# Patient Record
Sex: Male | Born: 1950 | Hispanic: No | Marital: Married
Health system: Southern US, Community
[De-identification: ages and names within clinical notes are randomized; demographics above are authoritative.]

## PROBLEM LIST (undated history)

## (undated) DIAGNOSIS — I639 Cerebral infarction, unspecified: Secondary | ICD-10-CM

## (undated) DIAGNOSIS — I10 Essential (primary) hypertension: Secondary | ICD-10-CM

## (undated) DIAGNOSIS — K559 Vascular disorder of intestine, unspecified: Secondary | ICD-10-CM

## (undated) DIAGNOSIS — E119 Type 2 diabetes mellitus without complications: Secondary | ICD-10-CM

---

## 2016-01-01 DIAGNOSIS — I639 Cerebral infarction, unspecified: Secondary | ICD-10-CM

## 2016-01-01 DIAGNOSIS — K559 Vascular disorder of intestine, unspecified: Secondary | ICD-10-CM

## 2016-01-01 HISTORY — DX: Cerebral infarction, unspecified: I63.9

## 2016-01-01 HISTORY — PX: ILEOSTOMY: SHX1783

## 2016-01-01 HISTORY — DX: Vascular disorder of intestine, unspecified: K55.9

## 2016-01-01 HISTORY — PX: TOTAL COLECTOMY: SHX852

## 2016-01-31 ENCOUNTER — Inpatient Hospital Stay
Admission: AD | Admit: 2016-01-31 | Discharge: 2016-02-07 | Disposition: A | Discharge status: Other Acute Inpatient Hospital | Payer: Managed Care, Other (non HMO) | Source: Other Acute Inpatient Hospital | Attending: Pulmonary Disease | Admitting: Pulmonary Disease

## 2016-01-31 DIAGNOSIS — Z992 Dependence on renal dialysis: Secondary | ICD-10-CM

## 2016-01-31 DIAGNOSIS — Z4659 Encounter for fitting and adjustment of other gastrointestinal appliance and device: Secondary | ICD-10-CM

## 2016-01-31 DIAGNOSIS — R579 Shock, unspecified: Secondary | ICD-10-CM | POA: Diagnosis present

## 2016-01-31 HISTORY — DX: Type 2 diabetes mellitus without complications: E11.9

## 2016-01-31 HISTORY — DX: Essential (primary) hypertension: I10

## 2016-01-31 HISTORY — DX: Vascular disorder of intestine, unspecified: K55.9

## 2016-01-31 HISTORY — DX: Cerebral infarction, unspecified: I63.9

## 2016-01-31 LAB — COMPREHENSIVE METABOLIC PANEL
ALBUMIN: 3 g/dL — AB (ref 3.5–5.0)
ALT: 58 U/L (ref 17–63)
AST: 32 U/L (ref 15–41)
Alkaline Phosphatase: 146 U/L — ABNORMAL HIGH (ref 38–126)
Anion gap: 7 (ref 5–15)
BUN: 38 mg/dL — AB (ref 6–20)
CHLORIDE: 118 mmol/L — AB (ref 101–111)
CO2: 14 mmol/L — AB (ref 22–32)
CREATININE: 1.28 mg/dL — AB (ref 0.61–1.24)
Calcium: 10.3 mg/dL (ref 8.9–10.3)
GFR calc Af Amer: >60 mL/min (ref >60)
GFR calc non Af Amer: 56 mL/min — ABNORMAL LOW (ref >60)
Glucose, Bld: 145 mg/dL — ABNORMAL HIGH (ref 65–99)
POTASSIUM: 4.9 mmol/L (ref 3.5–5.1)
SODIUM: 139 mmol/L (ref 135–145)
Total Bilirubin: 0.5 mg/dL (ref 0.3–1.2)
Total Protein: 6.2 g/dL — ABNORMAL LOW (ref 6.5–8.1)

## 2016-01-31 LAB — CBC WITH DIFFERENTIAL/PLATELET
Basophils Absolute: 0 10*3/uL (ref 0.0–0.1)
Basophils Relative: 0 %
EOS ABS: 0 10*3/uL (ref 0.0–0.7)
EOS PCT: 0 %
HCT: 32.5 % — ABNORMAL LOW (ref 39.0–52.0)
HEMOGLOBIN: 10.9 g/dL — AB (ref 13.0–17.0)
LYMPHS ABS: 1.4 10*3/uL (ref 0.7–4.0)
LYMPHS PCT: 10 %
MCH: 32.6 pg (ref 26.0–34.0)
MCHC: 33.5 g/dL (ref 30.0–36.0)
MCV: 97.3 fL (ref 78.0–100.0)
MONOS PCT: 8 %
Monocytes Absolute: 1.1 10*3/uL — ABNORMAL HIGH (ref 0.1–1.0)
Neutro Abs: 11.8 10*3/uL — ABNORMAL HIGH (ref 1.7–7.7)
Neutrophils Relative %: 82 %
PLATELETS: 296 10*3/uL (ref 150–400)
RBC: 3.34 MIL/uL — AB (ref 4.22–5.81)
RDW: 13.2 % (ref 11.5–15.5)
WBC: 14.3 10*3/uL — AB (ref 4.0–10.5)

## 2016-02-05 LAB — BASIC METABOLIC PANEL
Anion gap: 10 (ref 5–15)
BUN: 98 mg/dL — AB (ref 6–20)
BUN: 104 mg/dL — AB (ref 6–20)
BUN: 108 mg/dL — ABNORMAL HIGH (ref 6–20)
CO2: 13 mmol/L — ABNORMAL LOW (ref 22–32)
CO2: 10 mmol/L — ABNORMAL LOW (ref 22–32)
CO2: 8 mmol/L — AB (ref 22–32)
CREATININE: 3.26 mg/dL — AB (ref 0.61–1.24)
Calcium: 10.8 mg/dL — ABNORMAL HIGH (ref 8.9–10.3)
Calcium: 10.4 mg/dL — ABNORMAL HIGH (ref 8.9–10.3)
Calcium: 10.8 mg/dL — ABNORMAL HIGH (ref 8.9–10.3)
Chloride: 127 mmol/L — ABNORMAL HIGH (ref 101–111)
Creatinine, Ser: 3.53 mg/dL — ABNORMAL HIGH (ref 0.61–1.24)
Creatinine, Ser: 3.89 mg/dL — ABNORMAL HIGH (ref 0.61–1.24)
GFR calc Af Amer: 21 mL/min — ABNORMAL LOW (ref >60)
GFR calc Af Amer: 19 mL/min — ABNORMAL LOW (ref >60)
GFR calc non Af Amer: 17 mL/min — ABNORMAL LOW (ref >60)
GFR calc non Af Amer: 15 mL/min — ABNORMAL LOW (ref >60)
GFR, EST AFRICAN AMERICAN: 17 mL/min — AB (ref >60)
GFR, EST NON AFRICAN AMERICAN: 18 mL/min — AB (ref >60)
GLUCOSE: 125 mg/dL — AB (ref 65–99)
Glucose, Bld: 132 mg/dL — ABNORMAL HIGH (ref 65–99)
Glucose, Bld: 134 mg/dL — ABNORMAL HIGH (ref 65–99)
POTASSIUM: 5.9 mmol/L — AB (ref 3.5–5.1)
POTASSIUM: 6 mmol/L — AB (ref 3.5–5.1)
Potassium: 5.9 mmol/L — ABNORMAL HIGH (ref 3.5–5.1)
SODIUM: 150 mmol/L — AB (ref 135–145)
SODIUM: 150 mmol/L — AB (ref 135–145)
SODIUM: 154 mmol/L — AB (ref 135–145)

## 2016-02-05 LAB — CBC
HCT: 35.5 % — ABNORMAL LOW (ref 39.0–52.0)
Hemoglobin: 11.3 g/dL — ABNORMAL LOW (ref 13.0–17.0)
MCH: 32.6 pg (ref 26.0–34.0)
MCHC: 31.8 g/dL (ref 30.0–36.0)
MCV: 102.3 fL — AB (ref 78.0–100.0)
PLATELETS: 306 10*3/uL (ref 150–400)
RBC: 3.47 MIL/uL — AB (ref 4.22–5.81)
RDW: 14.9 % (ref 11.5–15.5)
WBC: 10.1 10*3/uL (ref 4.0–10.5)

## 2016-02-06 LAB — BASIC METABOLIC PANEL
BUN: 116 mg/dL — AB (ref 6–20)
CALCIUM: 10.3 mg/dL (ref 8.9–10.3)
CO2: 9 mmol/L — ABNORMAL LOW (ref 22–32)
Chloride: >130 mmol/L (ref 101–111)
Creatinine, Ser: 4.59 mg/dL — ABNORMAL HIGH (ref 0.61–1.24)
GFR calc Af Amer: 14 mL/min — ABNORMAL LOW (ref >60)
GFR, EST NON AFRICAN AMERICAN: 12 mL/min — AB (ref >60)
Glucose, Bld: 151 mg/dL — ABNORMAL HIGH (ref 65–99)
POTASSIUM: 6 mmol/L — AB (ref 3.5–5.1)
SODIUM: 154 mmol/L — AB (ref 135–145)

## 2016-02-06 LAB — POTASSIUM: POTASSIUM: 5.8 mmol/L — AB (ref 3.5–5.1)

## 2016-02-07 ENCOUNTER — Inpatient Hospital Stay (HOSPITAL_COMMUNITY)
Admission: AD | Admit: 2016-02-07 | Discharge: 2016-02-10 | DRG: 871 | Disposition: A | Discharge status: Other Acute Inpatient Hospital | Payer: Managed Care, Other (non HMO) | Source: Ambulatory Visit | Attending: Pulmonary Disease | Admitting: Pulmonary Disease

## 2016-02-07 ENCOUNTER — Other Ambulatory Visit (HOSPITAL_COMMUNITY): Payer: Managed Care, Other (non HMO)

## 2016-02-07 ENCOUNTER — Inpatient Hospital Stay (HOSPITAL_COMMUNITY): Payer: Managed Care, Other (non HMO)

## 2016-02-07 ENCOUNTER — Encounter: Payer: Self-pay | Admitting: Pulmonary Disease

## 2016-02-07 DIAGNOSIS — N179 Acute kidney failure, unspecified: Secondary | ICD-10-CM | POA: Diagnosis present

## 2016-02-07 DIAGNOSIS — E870 Hyperosmolality and hypernatremia: Secondary | ICD-10-CM | POA: Diagnosis present

## 2016-02-07 DIAGNOSIS — G9340 Encephalopathy, unspecified: Secondary | ICD-10-CM

## 2016-02-07 DIAGNOSIS — E119 Type 2 diabetes mellitus without complications: Secondary | ICD-10-CM | POA: Diagnosis present

## 2016-02-07 DIAGNOSIS — E871 Hypo-osmolality and hyponatremia: Secondary | ICD-10-CM | POA: Diagnosis present

## 2016-02-07 DIAGNOSIS — I69351 Hemiplegia and hemiparesis following cerebral infarction affecting right dominant side: Secondary | ICD-10-CM | POA: Diagnosis not present

## 2016-02-07 DIAGNOSIS — R6521 Severe sepsis with septic shock: Secondary | ICD-10-CM | POA: Diagnosis not present

## 2016-02-07 DIAGNOSIS — Z932 Ileostomy status: Secondary | ICD-10-CM | POA: Diagnosis not present

## 2016-02-07 DIAGNOSIS — E872 Acidosis: Secondary | ICD-10-CM | POA: Diagnosis not present

## 2016-02-07 DIAGNOSIS — R4701 Aphasia: Secondary | ICD-10-CM | POA: Diagnosis present

## 2016-02-07 DIAGNOSIS — G9341 Metabolic encephalopathy: Secondary | ICD-10-CM | POA: Diagnosis present

## 2016-02-07 DIAGNOSIS — L8990 Pressure ulcer of unspecified site, unspecified stage: Secondary | ICD-10-CM | POA: Insufficient documentation

## 2016-02-07 DIAGNOSIS — D649 Anemia, unspecified: Secondary | ICD-10-CM | POA: Diagnosis present

## 2016-02-07 DIAGNOSIS — A419 Sepsis, unspecified organism: Principal | ICD-10-CM | POA: Diagnosis present

## 2016-02-07 DIAGNOSIS — R4182 Altered mental status, unspecified: Secondary | ICD-10-CM | POA: Diagnosis present

## 2016-02-07 DIAGNOSIS — K560 Paralytic ileus: Secondary | ICD-10-CM

## 2016-02-07 DIAGNOSIS — E669 Obesity, unspecified: Secondary | ICD-10-CM | POA: Diagnosis present

## 2016-02-07 DIAGNOSIS — E874 Mixed disorder of acid-base balance: Secondary | ICD-10-CM | POA: Diagnosis present

## 2016-02-07 DIAGNOSIS — Z9049 Acquired absence of other specified parts of digestive tract: Secondary | ICD-10-CM | POA: Diagnosis not present

## 2016-02-07 DIAGNOSIS — R579 Shock, unspecified: Secondary | ICD-10-CM | POA: Diagnosis present

## 2016-02-07 DIAGNOSIS — K56609 Unspecified intestinal obstruction, unspecified as to partial versus complete obstruction: Secondary | ICD-10-CM

## 2016-02-07 DIAGNOSIS — I10 Essential (primary) hypertension: Secondary | ICD-10-CM | POA: Diagnosis present

## 2016-02-07 DIAGNOSIS — Z6832 Body mass index (BMI) 32.0-32.9, adult: Secondary | ICD-10-CM | POA: Diagnosis not present

## 2016-02-07 DIAGNOSIS — E875 Hyperkalemia: Secondary | ICD-10-CM | POA: Diagnosis present

## 2016-02-07 LAB — BASIC METABOLIC PANEL
ANION GAP: 15 (ref 5–15)
Anion gap: 14 (ref 5–15)
BUN: 127 mg/dL — AB (ref 6–20)
BUN: 133 mg/dL — ABNORMAL HIGH (ref 6–20)
CALCIUM: 10.8 mg/dL — AB (ref 8.9–10.3)
CALCIUM: 9.9 mg/dL (ref 8.9–10.3)
CO2: 9 mmol/L — ABNORMAL LOW (ref 22–32)
CO2: 8 mmol/L — ABNORMAL LOW (ref 22–32)
CREATININE: 6.46 mg/dL — AB (ref 0.61–1.24)
CREATININE: 6.79 mg/dL — AB (ref 0.61–1.24)
Chloride: 124 mmol/L — ABNORMAL HIGH (ref 101–111)
Chloride: 123 mmol/L — ABNORMAL HIGH (ref 101–111)
GFR calc Af Amer: 9 mL/min — ABNORMAL LOW (ref >60)
GFR calc non Af Amer: 7 mL/min — ABNORMAL LOW (ref >60)
GFR, EST AFRICAN AMERICAN: 9 mL/min — AB (ref >60)
GFR, EST NON AFRICAN AMERICAN: 8 mL/min — AB (ref >60)
GLUCOSE: 151 mg/dL — AB (ref 65–99)
Glucose, Bld: 169 mg/dL — ABNORMAL HIGH (ref 65–99)
Potassium: 6.1 mmol/L — ABNORMAL HIGH (ref 3.5–5.1)
Potassium: 6 mmol/L — ABNORMAL HIGH (ref 3.5–5.1)
SODIUM: 145 mmol/L (ref 135–145)
Sodium: 148 mmol/L — ABNORMAL HIGH (ref 135–145)

## 2016-02-07 LAB — BLOOD GAS, ARTERIAL
Acid-base deficit: 21.2 mmol/L — ABNORMAL HIGH (ref 0.0–2.0)
BICARBONATE: 6.2 meq/L — AB (ref 20.0–24.0)
FIO2: 0.21
O2 SAT: 97.2 %
PATIENT TEMPERATURE: 98.6
PO2 ART: 119 mmHg — AB (ref 80.0–100.0)
TCO2: 6.8 mmol/L (ref 0–100)
pCO2 arterial: 18.9 mmHg — CL (ref 35.0–45.0)
pH, Arterial: 7.144 — CL (ref 7.350–7.450)

## 2016-02-07 LAB — CBC
HCT: 41.4 % (ref 39.0–52.0)
HCT: 33.7 % — ABNORMAL LOW (ref 39.0–52.0)
Hemoglobin: 13.2 g/dL (ref 13.0–17.0)
Hemoglobin: 10.4 g/dL — ABNORMAL LOW (ref 13.0–17.0)
MCH: 33 pg (ref 26.0–34.0)
MCH: 32.1 pg (ref 26.0–34.0)
MCHC: 31.9 g/dL (ref 30.0–36.0)
MCHC: 30.9 g/dL (ref 30.0–36.0)
MCV: 103.5 fL — ABNORMAL HIGH (ref 78.0–100.0)
MCV: 104 fL — ABNORMAL HIGH (ref 78.0–100.0)
Platelets: 278 10*3/uL (ref 150–400)
Platelets: 276 10*3/uL (ref 150–400)
RBC: 4 MIL/uL — ABNORMAL LOW (ref 4.22–5.81)
RBC: 3.24 MIL/uL — ABNORMAL LOW (ref 4.22–5.81)
RDW: 14.8 % (ref 11.5–15.5)
RDW: 14.7 % (ref 11.5–15.5)
WBC: 17.7 10*3/uL — ABNORMAL HIGH (ref 4.0–10.5)
WBC: 16.3 10*3/uL — ABNORMAL HIGH (ref 4.0–10.5)

## 2016-02-07 LAB — LACTIC ACID, PLASMA: LACTIC ACID, VENOUS: 1.2 mmol/L (ref 0.5–1.9)

## 2016-02-07 LAB — MRSA PCR SCREENING: MRSA BY PCR: POSITIVE — AB

## 2016-02-07 MED ORDER — FAMOTIDINE IN NACL 20-0.9 MG/50ML-% IV SOLN: 20.0000 mg | INTRAVENOUS | Status: DC

## 2016-02-07 MED ORDER — SODIUM CHLORIDE 0.9 % IV SOLN: 250.0000 mL | INTRAVENOUS | Status: DC | PRN

## 2016-02-07 MED ORDER — CHLORHEXIDINE GLUCONATE 0.12 % MT SOLN
15.0000 mL | Freq: Two times a day (BID) | OROMUCOSAL | Status: DC
  Administered 2016-02-08 – 2016-02-10 (×6): 15 mL via OROMUCOSAL

## 2016-02-07 MED ORDER — HEPARIN SODIUM (PORCINE) 1000 UNIT/ML DIALYSIS
1000.0000 [IU] | INTRAMUSCULAR | Status: DC | PRN
  Filled 2016-02-07: qty 6

## 2016-02-07 MED ORDER — SODIUM BICARBONATE 8.4 % IV SOLN
INTRAVENOUS | Status: DC
Start: 2016-02-07 — End: 2016-02-07
  Filled 2016-02-07 (×2): qty 150

## 2016-02-07 MED ORDER — NOREPINEPHRINE BITARTRATE 1 MG/ML IV SOLN
5.0000 ug/min | INTRAVENOUS | Status: DC
  Administered 2016-02-08: 15 ug/min via INTRAVENOUS
  Filled 2016-02-07: qty 4

## 2016-02-07 MED ORDER — SODIUM CHLORIDE 0.9 % IJ SOLN
250.0000 [IU]/h | INTRAMUSCULAR | Status: DC
  Administered 2016-02-08: 950 [IU]/h via INTRAVENOUS_CENTRAL
  Administered 2016-02-08 – 2016-02-09 (×2): 1250 [IU]/h via INTRAVENOUS_CENTRAL
  Filled 2016-02-07 (×5): qty 2

## 2016-02-07 MED ORDER — STERILE WATER FOR INJECTION IV SOLN
INTRAVENOUS | Status: DC
  Administered 2016-02-07 – 2016-02-08 (×5): via INTRAVENOUS_CENTRAL
  Filled 2016-02-07 (×16): qty 150

## 2016-02-07 MED ORDER — HEPARIN BOLUS VIA INFUSION (CRRT)
1000.0000 [IU] | INTRAVENOUS | Status: DC | PRN
  Administered 2016-02-08 (×2): 1000 [IU] via INTRAVENOUS_CENTRAL
  Filled 2016-02-07: qty 1000

## 2016-02-07 MED ORDER — HEPARIN SODIUM (PORCINE) 5000 UNIT/ML IJ SOLN
5000.0000 [IU] | Freq: Three times a day (TID) | INTRAMUSCULAR | Status: DC
Start: 2016-02-07 — End: 2016-02-07

## 2016-02-07 MED ORDER — CETYLPYRIDINIUM CHLORIDE 0.05 % MT LIQD
7.0000 mL | Freq: Two times a day (BID) | OROMUCOSAL | Status: DC
  Administered 2016-02-08 – 2016-02-10 (×5): 7 mL via OROMUCOSAL

## 2016-02-07 MED ORDER — MUPIROCIN 2 % EX OINT
1.0000 "application " | TOPICAL_OINTMENT | Freq: Two times a day (BID) | CUTANEOUS | Status: DC
  Administered 2016-02-08 – 2016-02-10 (×6): 1 via NASAL
  Filled 2016-02-07 (×2): qty 22

## 2016-02-07 MED ORDER — FAMOTIDINE IN NACL 20-0.9 MG/50ML-% IV SOLN
20.0000 mg | Freq: Two times a day (BID) | INTRAVENOUS | Status: DC
  Administered 2016-02-07 – 2016-02-09 (×4): 20 mg via INTRAVENOUS
  Filled 2016-02-07 (×5): qty 50

## 2016-02-07 MED ORDER — SODIUM CHLORIDE 0.9 % IV SOLN: INTRAVENOUS | Status: DC

## 2016-02-07 MED ORDER — STERILE WATER FOR INJECTION IV SOLN
INTRAVENOUS | Status: DC
  Administered 2016-02-07 – 2016-02-08 (×3): via INTRAVENOUS_CENTRAL
  Filled 2016-02-07 (×10): qty 150

## 2016-02-07 MED ORDER — HEPARIN (PORCINE) 2000 UNITS/L FOR CRRT
INTRAVENOUS_CENTRAL | Status: DC | PRN
  Administered 2016-02-07 – 2016-02-08 (×2): via INTRAVENOUS_CENTRAL
  Filled 2016-02-07 (×9): qty 1000

## 2016-02-07 MED ORDER — CHLORHEXIDINE GLUCONATE CLOTH 2 % EX PADS
6.0000 | MEDICATED_PAD | Freq: Every day | CUTANEOUS | Status: DC
  Administered 2016-02-08 – 2016-02-10 (×3): 6 via TOPICAL

## 2016-02-07 MED ORDER — PRISMASOL BGK 0/2.5 32-2.5 MEQ/L IV SOLN
INTRAVENOUS | Status: DC
  Administered 2016-02-07 – 2016-02-08 (×5): via INTRAVENOUS_CENTRAL
  Filled 2016-02-07 (×12): qty 5000

## 2016-02-07 MED ORDER — PIPERACILLIN-TAZOBACTAM 3.375 G IVPB 30 MIN
3.3750 g | Freq: Four times a day (QID) | INTRAVENOUS | Status: DC
  Administered 2016-02-07 – 2016-02-09 (×7): 3.375 g via INTRAVENOUS
  Filled 2016-02-07 (×11): qty 50

## 2016-02-07 MED ORDER — HEPARIN SODIUM (PORCINE) 1000 UNIT/ML IJ SOLN
2.4000 mL | Freq: Once | INTRAMUSCULAR | Status: AC
  Administered 2016-02-07: 3000 [IU] via INTRAVENOUS
  Filled 2016-02-07: qty 3

## 2016-02-07 MED ORDER — SODIUM CHLORIDE 0.9 % IV SOLN
250.0000 mL | INTRAVENOUS | Status: DC | PRN
  Administered 2016-02-07: 1000 mL via INTRAVENOUS
  Administered 2016-02-08 – 2016-02-10 (×2): 250 mL via INTRAVENOUS

## 2016-02-07 MED ORDER — SODIUM BICARBONATE 8.4 % IV SOLN
100.0000 meq | Freq: Once | INTRAVENOUS | Status: AC
  Administered 2016-02-07: 100 meq via INTRAVENOUS
  Filled 2016-02-07: qty 100

## 2016-02-07 MED ORDER — HEPARIN SODIUM (PORCINE) 5000 UNIT/ML IJ SOLN
5000.0000 [IU] | Freq: Three times a day (TID) | INTRAMUSCULAR | Status: DC
  Filled 2016-02-07: qty 1

## 2016-02-07 NOTE — H&P (Signed)
PULMONARY / CRITICAL CARE MEDICINE   Name:           Brett Jenkins MRN:             [id number] DOB:             12/05/1950                       ADMISSION DATE:  01/31/2016 CONSULTATION DATE:  8/7  REFERRING MD:  D. Hijazi  CHIEF COMPLAINT:  AMS  HISTORY OF PRESENT ILLNESS: Patient is encephalopathic and/or intubated. Therefore history has been obtained from chart review.  65 year old male who was recently [facility] to [facility] for L sided CVA. He underwent embolectomy of L MCA. Angioplasty of the internal carotid was unsuccessful. 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 After extubation he was transferred to [facility] for [facility]. [facility] has been complicated by worsening renal failure despite IVF hydration. Creatinine was initially 3.26 and has risen to 6.79. He also developed hyperkalemia and hyponatremia. WBC rose as well so he was started on broad spectrum antibiotics. 8/7 his mental status started to deteriorate and he developed profound acidemia. PCCM ashed to see for further evaluation.    PAST MEDICAL HISTORY :  He  has a past medical history of CVA (cerebral infarction) (01/2016); DM (diabetes mellitus) (HCC); HTN (hypertension); and Ischemic colitis (HCC) (01/2016).  PAST SURGICAL HISTORY: He  has a past surgical history that includes Total colectomy (01/2016) and Ileostomy (01/2016).  Allergies not on file  No current facility-administered medications on file prior to encounter.    No current outpatient prescriptions on file prior to encounter.    FAMILY HISTORY:  His has no family status information on file.    SOCIAL HISTORY: He    REVIEW OF SYSTEMS:   Unable as patient is encephalopathic  SUBJECTIVE:    VITAL  SIGNS: There were no vitals taken for this visit.  HEMODYNAMICS: PAP: ()/()   VENTILATOR SETTINGS:    INTAKE / OUTPUT: No intake/output data recorded.  PHYSICAL EXAMINATION: General:  Obese male in NAD Neuro:  Obtunded, Does not respond to pain HEENT:  [place]/AT, PERRL, no JVD. NGT in place Cardiovascular:  IRIR, rate controlled. No MRG Lungs:  Clear bilateral breath sounds Abdomen:  Soft, non-distended. Longitudinal surgical scar (recent with steri strips in place), Ileostomy draining large volume dark green stool.  Musculoskeletal:  No acute deformity Skin:  Grossly intact  LABS:  BMET  Last Labs    Recent Labs Lab 02/06/16 0801 02/06/16 1502 02/07/16 0536 02/07/16 1013  NA 154*  --  148* 145  K 6.0* 5.8* 6.1* 6.0*  CL >130*  --  124* 123*  CO2 9*  --  9* 8*  BUN 116*  --  127* 133*  CREATININE 4.59*  --  6.46* 6.79*  GLUCOSE 151*  --  151* 169*      Electrolytes  Last Labs    Recent Labs Lab 02/06/16 0801 02/07/16 0536 02/07/16 1013  CALCIUM 10.3 10.8* 9.9      CBC  Last Labs    Recent Labs Lab 02/05/16 0619 02/07/16 0536 02/07/16 1013  WBC 10.1 17.7* 16.3*  HGB 11.3* 13.2 10.4*  HCT 35.5* 41.4 33.7*  PLT 306 278 276      Coag's  Last Labs   No results for input(s): APTT, INR in the last 168 hours.    Sepsis Markers Last Labs   No results for input(s): LATICACIDVEN, PROCALCITON, O2SATVEN in the last 168 hours.    ABG  Last Labs    Recent Labs Lab 02/07/16 0926  PHART 7.144*  PCO2ART 18.9*  PO2ART 119*      Liver Enzymes  Last Labs    Recent Labs Lab 01/31/16 1809  AST 32  ALT 58  ALKPHOS 146*  BILITOT 0.5  ALBUMIN 3.0*      Cardiac Enzymes Last Labs   No results for input(s): TROPONINI, PROBNP in the last 168 hours.    Glucose Last Labs   No results for input(s): GLUCAP in the last 168 hours.    Imaging Dg Abd Portable 1v  Result Date: 02/07/2016 CLINICAL DATA:  Feeding  tube placement. EXAM: PORTABLE ABDOMEN - 1 VIEW COMPARISON:  None. FINDINGS: Feeding tube is in place with the tip projecting in the midbody of the stomach. Gaseous distention of small bowel noted. IMPRESSION: Feeding tube tip projects in the midbody of the stomach. Gaseous distention of small bowel. Electronically Signed   By: Drusilla Kanner M.D.   On: 02/07/2016 10:44     STUDIES:    CULTURES: 8/7 BCx2 8/7 Urine Cx  ANTIBIOTICS: Zosyn 8/7 >>> Vancomycin 8/7 >>>  SIGNIFICANT EVENTS: 8/7 [facility] for renal failure and AMS  LINES/TUBES:   DISCUSSION: 65 year old male with recent stroke with now global aphasia and R sided weakness. Also with recent bowel ischemia s/p colectomy and ileostomy. Presented to [facility] from [facility] 7/31 for further rehab efforts. [facility] course complicated by renal failure and AMS. Transferred to ICU with plans for hydration, Bicarb gtt, pressors, likely intubation and CRRT.   ASSESSMENT / PLAN:  PULMONARY A: Inability to protect airway  P:   Intubate for airway protection Full vent support ABG CXR Vent bundle  CARDIOVASCULAR A:  Shock, hypovolemic vs sepsis  P:  Telemetry monitoring MAP goal > Levophed for MAP goal Gentle IVF Assess lactic acid, troponin x3, and cortisol.   RENAL A:   Acute renal failure (Creatinine 3.26 > 6.79) Hypernatremia Hyperkalemia  P:   Transfer to ICU Consult nephrology Will likely need CRRT in setting shock Place temp HD catheter  Repeat CMP after interventions made in [facility] Treat electrolyte abnormalities as indicated.   GASTROINTESTINAL A:   Ileostomy status Nutrition  P:   NPO NGT to LIS Pepcid daily for SUP in setting renal failure  HEMATOLOGIC A:   Anemia   P:  Follow CBC Transfuse per ICU guidelines  INFECTIOUS A:   SIRS ? Sepsis  P:   Broad spectrum ABX as above Assess PCT Follow cultures  ENDOCRINE A:   DM  P:   CBG monitoring  and SSI q 4 hours.   NEUROLOGIC A:   Acute metabolic encephalopathy in setting uremia and profound acidosis  P:   RASS goal: 0 PRN fentanyl if needed    FAMILY  - Updates:    - Inter-disciplinary family meet or Palliative Care meeting due by:  8/14  [name], AGACNP-BC [place] [facility]monology/Critical Care Pager [phone] or [phone]  02/07/2016 5:19 PM   ATTENDING NOTE: I have personally reviewed patient's available data, including medical history, events of note, physical examination and test results as part of my evaluation. I have discussed with resident/NP and other careteam providers such as pharmacist, RN and  RRT & co-ordinated with consultants. 65 year old man with diabetes and hypertension, left MCA CVA- [facility] underwent total colectomy on 7/20 for ischemic colitis and was recuperating with a colostomy. He was transferred on lisinopril for hypertension-his creatinine is increased from 1.2 baseline to 6.8 currently and his potassium and increased to 6 he was noted to be hypotensive today, we were requested to [facility] for CRRT  On exam-lethargic, does not follow commands, eyes open, acidotic breathing, decreased breath sounds bilateral, decreased movement on right side, soft nontender abdomen with midline scar,ileostomy appears pink  Labs showed hyperkalemia at 6.0 with severe acidosis 7.14 and leukocytosis 16  X-ray KUB shows small bowel dilatation  Impression/plan-  AKI with severe metabolic acidosis- could be related to lisinopril, have to consider new sepsis or ischemic bowel -We'll place HD catheter and plan for CRRT -Start bicarbonate drip  Septic shock- unclear source, have to rule out ischemic bowel We'll obtain CT abdomen with oral contrast when more stable Add Levophed aim for MAP 65 and above Empiric Zosyn in the meantime NG tube to decompression  I called his wife and  updated her in detail- she would like all resuscitative measures at this time but does understand how sick he is with multi -organ failure Rest per NP/medical resident whose note is outlined above and that I agree with and edited in full.    The patient is critically ill with multiple organ systems failure and requires high complexity decision making for assessment and support, frequent evaluation and titration of therapies, application of advanced monitoring technologies and extensive interpretation of multiple databases. Critical Care Time devoted to patient care services described in this note independent of APP time is 55 minutes.    Cyril Mourning MD. Tonny Bollman. [place] [facility] Pager [phone] If no response call [phone]   02/07/2016

## 2016-02-07 NOTE — Progress Notes (Signed)
Pharmacy Antibiotic Note Brett Jenkins is a 65 y.o. male admitted on 02/07/2016 with sepsis in setting of AKI and tentative plans for CRRT. Pharmacy has been consulted for Zosyn dosing.  Plan:  Zosyn 3.375 grams IV every 6 hours infused over 30 minutes while receiving CRRT    Temp (24hrs), Avg:0 F (-17.8 C), Min:, Max:   Recent Labs Lab 02/05/16 0619 02/05/16 1521 02/05/16 2243 02/06/16 0801 02/07/16 0536 02/07/16 1013  WBC 10.1  --   --   --  17.7* 16.3*  CREATININE 3.26* 3.53* 3.89* 4.59* 6.46* 6.79*    CrCl cannot be calculated (Unknown ideal weight.).  Allergies not on file  Antimicrobials this admission: 8/7 Zosyn >>   Dose adjustments this admission: n/a  Microbiology results: 8/7 BCx: px   Thank you for allowing pharmacy to be a part of this patient's care.  Pollyann SamplesAndy Demaurion Dicioccio, PharmD, BCPS 02/07/2016, 6:34 PM Pager: [phone]

## 2016-02-07 NOTE — Procedures (Signed)
Hemodialysis Insertion Procedure Note Brett Jenkins [id number] 02/20/1951  Procedure: Insertion of Hemodialysis Catheter Type: 3 port  Indications: Hemodialysis   Procedure Details Consent: Risks of procedure as well as the alternatives and risks of each were explained to the (patient/caregiver).  Consent for procedure obtained. Time Out: Verified patient identification, verified procedure, site/side was marked, verified correct patient position, special equipment/implants available, medications/allergies/relevent history reviewed, required imaging and test results available.  Performed  Maximum sterile technique was used including antiseptics, cap, gloves, gown, hand hygiene, mask and sheet. Skin prep: Chlorhexidine; local anesthetic administered A antimicrobial bonded/coated triple lumen catheter was placed in the right internal jugular vein using the Seldinger technique. Ultrasound guidance used.Yes.   Catheter placed to 15 cm. Blood aspirated via all 3 ports and then flushed x 3. Line sutured x 2 and dressing applied.  Evaluation Blood flow good Complications: No apparent complications Patient did tolerate procedure well. Chest X-ray ordered to verify placement.  CXR: pending.  Brett RoachPaul Raunak Antuna, AGACNP-BC [facility]/Critical Care Pager [phone] or [phone]  02/07/2016 6:26 PM

## 2016-02-07 NOTE — Consult Note (Signed)
Reason for Consult:ARF, hyperkalemia, metabolic acidosis Referring Physician: Marchelle Gearingamaswamy, MD  Brett Jenkins is an 65 y.o. male.  HPI: Brett Jenkins is a 65 yo WM with PMH significant for DM, HTN, who was admitted [facility] on 01[facility]/16 after Brett Jenkins developed an acute ischemic, Left MCA stroke complicated by right hemiparesis, dysphagia, and global aphasia.  His hospitalization was complicated by severe sepsis syndrome secondary to E. Coli urosepsis and developed ischemic colitis and underwent colectomy on 01/20/16 with ileostomy.  Brett Jenkins was [facility] for [facility] on 01/31/16, however Brett Jenkins developed hypotension and progressive renal failure with subsequent hyperkalemia and profound metabolic acidosis.  His trend in Scr is seen below.  Brett Jenkins was transferred to [facility] for further evaluation and management of his SIRS, ARF and metabolic abnormalities.  We were consulted for initiation of CVVHD to help correct his metabolic derangements.  Of note, Brett Jenkins had been on metformin and lisinopril-HCT prior to his admission to the MICU today.  These have since been discontinued.  Trend in Creatinine: Creatinine, Ser  Date/Time Value Ref Range Status  02/07/2016 10:13 AM 6.79 (H) 0.61 - 1.24 mg/dL Final  16/10/960408/01/2016 54:0905:36 AM 6.46 (H) 0.61 - 1.24 mg/dL Final  81/19/147808/12/2015 29:5608:01 AM 4.59 (H) 0.61 - 1.24 mg/dL Final  21/30/865708/11/2015 84:6910:43 PM 3.89 (H) 0.61 - 1.24 mg/dL Final  62/95/284108/11/2015 32:4403:21 PM 3.53 (H) 0.61 - 1.24 mg/dL Final  01/02/725308/11/2015 66:4406:19 AM 3.26 (H) 0.61 - 1.24 mg/dL Final  03/47/425907/31/2017 56:3806:09 PM 1.28 (H) 0.61 - 1.24 mg/dL Final    PMH:   Past Medical History:  Diagnosis Date  . CVA (cerebral infarction) 01/2016  . DM (diabetes mellitus) (HCC)   . HTN (hypertension)   . Ischemic colitis (HCC) 01/2016    PSH:   Past Surgical History:  Procedure Laterality Date  . ILEOSTOMY  01/2016  . TOTAL COLECTOMY  01/2016    Allergies: Allergies not on  file  Medications:   Prior to Admission medications   Not on File    Inpatient medications: . famotidine (PEPCID) IV  20 mg Intravenous Q12H  . heparin  5,000 Units Subcutaneous Q8H  . piperacillin-tazobactam  3.375 g Intravenous Q6H    Discontinued Meds:  There are no discontinued medications.  Social History:  has no tobacco, alcohol, and drug history on file.  Family History:  No family history on file.  Review of systems not obtained due to patient factors. Weight change:  No intake or output data in the 24 hours ending 02/07/16 1857  Pulse: 75, BP 97/48, RR 19, Pox 99% on RA   General appearance: unresponsive, obtunded Head: Normocephalic, without obvious abnormality, atraumatic Resp: clear to auscultation bilaterally Cardio: regular rate and rhythm, S1, S2 normal, no murmur, click, rub or gallop GI: ileostomy in RUQ, with green substance in collection bag, +BS, soft Extremities: extremities normal, atraumatic, no cyanosis or edema  Labs: Basic Metabolic Panel:  Recent Labs Lab 02/05/16 0619 02/05/16 1521 02/05/16 2243 02/06/16 0801 02/06/16 1502 02/07/16 0536 02/07/16 1013  NA 150* 150* 154* 154*  --  148* 145  K 5.9* 6.0* 5.9* 6.0* 5.8* 6.1* 6.0*  CL 127* >130* >130* >130*  --  124* 123*  CO2 13* 10* 8* 9*  --  9* 8*  GLUCOSE 125* 132* 134* 151*  --  151* 169*  BUN 98* 104* 108* 116*  --  127* 133*  CREATININE 3.26* 3.53* 3.89* 4.59*  --  6.46* 6.79*  CALCIUM 10.8* 10.4* 10.8* 10.3  --  10.8* 9.9   Liver Function Tests: No results for input(s): AST, ALT, ALKPHOS, BILITOT, PROT, ALBUMIN in the last 168 hours. No results for input(s): LIPASE, AMYLASE in the last 168 hours. No results for input(s): AMMONIA in the last 168 hours. CBC:  Recent Labs Lab 02/05/16 0619 02/07/16 0536 02/07/16 1013  WBC 10.1 17.7* 16.3*  HGB 11.3* 13.2 10.4*  HCT 35.5* 41.4 33.7*  MCV 102.3* 103.5* 104.0*  PLT 306 278 276   PT/INR: (inr:5) Cardiac  Enzymes: )No results for input(s): CKTOTAL, CKMB, CKMBINDEX, TROPONINI in the last 168 hours. CBG: No results for input(s): GLUCAP in the last 168 hours.  Iron Studies: No results for input(s): IRON, TIBC, TRANSFERRIN, FERRITIN in the last 168 hours.  Xrays/Other Studies: Dg Chest Port 1 View  Result Date: 02/07/2016 CLINICAL DATA:  Catheter placement. EXAM: PORTABLE CHEST 1 VIEW COMPARISON:  None. FINDINGS: The heart size and mediastinal contours are within normal limits. Both lungs are clear. No pneumothorax or pleural effusion is noted. Nasogastric tube is seen entering stomach. Right internal jugular catheter is noted with distal tip in expected position of SVC. The visualized skeletal structures are unremarkable. IMPRESSION: Right internal jugular catheter tip seen in expected position of the SVC. No pneumothorax is noted. Electronically Signed   By: Lupita Raider, M.D.   On: 02/07/2016 18:44   Dg Abd Portable 1v  Result Date: 02/07/2016 CLINICAL DATA:  Feeding tube placement. EXAM: PORTABLE ABDOMEN - 1 VIEW COMPARISON:  None. FINDINGS: Feeding tube is in place with the tip projecting in the midbody of the stomach. Gaseous distention of small bowel noted. IMPRESSION: Feeding tube tip projects in the midbody of the stomach. Gaseous distention of small bowel. Electronically Signed   By: Drusilla Kanner M.D.   On: 02/07/2016 10:44     Assessment/Plan: 1.  ARF- in setting of hypotension and concomitant ACE-inhibitor, diuretic, and metformin.   1. ACE/HCT and metformin stopped 2. Will initiate CVVHD with 0K bath and follow electrolytes 3. Renal US to r/o Obstruction 4. Continue to follow I's/O's and lab values 2. Hyperkalemia- due to #1 1. CVVD and bicarb as above 3. Metabolic acidosis due to ARF and metformin use.   1. Metformin stopped 2. Use bicarb with CVVHD 4. Severe sepsis syndrome- unclear etiology but has history of urosepsis at [facility].  Pan culture, antibiotics per PCCM 5. DM-  per primary svc 6. HTN- off meds for now in light of sepsis.   Julien Nordmann Kaoir Loree 02/07/2016, 6:57 PM

## 2016-02-07 NOTE — Consult Note (Signed)
[facility] CONSULT NOTE    Date: 02/07/2016                  Patient Name:  Brett Jenkins  MRN: [id number]  DOB: 05/08/1951  Age / Sex: 65 y.o., male         PCP: No PCP Per Patient                 Service Requesting Consult: Dr. Sharyon MedicusHijazi                 Reason for Consult: Severe acute renal failure with hyperkalemia and metabolic acidosis            History of Present Illness: Patient is a 65 y.o. male with a PMHx of diabetes mellitus type 2, hypertension, dysphagia, history of ischemic left MCA stroke, obesity, who was admitted to [facility] on 01/31/2016 for ongoing medical management status post recent ischemic left MCA stroke, dysphasia.  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  On 01/24/2016 unfortunately he suffered a left MCA distribution CVA with hemorrhage in the left basal ganglia.  We are asked now to see him for acute renal failure.  While at the outside hospital his BUN was 39 with a creatinine of 1.20.  Renal function has deteriorated over the course of this hospitalization. BUN is currently 133 with a creatinine of 6.79 and potassium 6.0. There is also severe acidosis with a serum bicarbonate of 8. Patient is unable unfortunately provide any history at this point in time. I discussed the case with Dr. Sharyon MedicusHijazi and recommended that continuous renal replacement therapy be considered given hypotension, severe acidosis, hyperkalemia, and acute renal failure.   Medications: Current medications: D5W, Humalog insulin, allopurinol 300 mg daily, amlodipine 5 mg daily, aspirin 325 mg daily, atenolol 50 mg daily, atorvastatin 80 mg daily, famotidine 20 mg twice a day, fish oil 6 g daily, heparin 5000 units subcutaneous every 8 hours, modafinil 100 mg daily, nicotine patch 14 mg daily, sertraline 250 mg daily, thiamine 100 mg daily    Allergies: No known drug allergies  Past Medical  History: Diabetes mellitus type 2, hypertension, dysphasia, history of ischemic left MCA stroke with hemorrhage, obesity, total colectomy 01/20/2016  Past Surgical History: Total colectomy in 01/20/2016  Family History: Unable to obtain from the patient given altered mental status.  Social History: Unable to obtain from the patient given altered mental status   Review of Systems: Unable to obtain from the patient given altered mental status.  Vital Signs: Temperature 98.9 pulse 67 respirations 20 blood pressure 85/58 Weight trends: There were no vitals filed for this visit.  Physical Exam: General: Critically ill-appearing  Head: Normocephalic, atraumatic. Oral mucosa extremely dry  Eyes: Anicteric, pupils 4 mm and sluggish to react  Nose: NG tube in place.  Throat: Difficult to fully visualize as patient not fully cooperative with exam.   Neck: Supple, trachea midline.  Lungs:  Normal respiratory effort. Clear to auscultation BL without crackles or wheezes.  Heart: RRR. S1 and S2 normal without gallop, murmur, or rubs.  Abdomen:  BS normoactive. Colostomy in place, nontender.  Extremities: No pretibial edema.  Neurologic: llethargic but arousable, not following commands consistently  Skin: No visible rashes, scars.    Lab results: Basic Metabolic Panel:  Recent Labs Lab 02/06/16 0801 02/06/16 1502 02/07/16 0536 02/07/16 1013  NA 154*  --  148* 145  K 6.0* 5.8*  6.1* 6.0*  CL >130*  --  124* 123*  CO2 9*  --  9* 8*  GLUCOSE 151*  --  151* 169*  BUN 116*  --  127* 133*  CREATININE 4.59*  --  6.46* 6.79*  CALCIUM 10.3  --  10.8* 9.9    Liver Function Tests:  Recent Labs Lab 01/31/16 1809  AST 32  ALT 58  ALKPHOS 146*  BILITOT 0.5  PROT 6.2*  ALBUMIN 3.0*   No results for input(s): LIPASE, AMYLASE in the last 168 hours. No results for input(s): AMMONIA in the last 168 hours.  CBC:  Recent Labs Lab 01/31/16 1809 02/05/16 0619 02/07/16 0536  02/07/16 1013  WBC 14.3* 10.1 17.7* 16.3*  NEUTROABS 11.8*  --   --   --   HGB 10.9* 11.3* 13.2 10.4*  HCT 32.5* 35.5* 41.4 33.7*  MCV 97.3 102.3* 103.5* 104.0*  PLT 296 306 278 276    Cardiac Enzymes: No results for input(s): CKTOTAL, CKMB, CKMBINDEX, TROPONINI in the last 168 hours.  BNP: Invalid input(s): POCBNP  CBG: No results for input(s): GLUCAP in the last 168 hours.  Microbiology: No results found for this or any previous visit.  Coagulation Studies: No results for input(s): LABPROT, INR in the last 72 hours.  Urinalysis: No results for input(s): COLORURINE, LABSPEC, PHURINE, GLUCOSEU, HGBUR, BILIRUBINUR, KETONESUR, PROTEINUR, UROBILINOGEN, NITRITE, LEUKOCYTESUR in the last 72 hours.  Invalid input(s): APPERANCEUR    Imaging: Dg Abd Portable 1v  Result Date: 02/07/2016 CLINICAL DATA:  Feeding tube placement. EXAM: PORTABLE ABDOMEN - 1 VIEW COMPARISON:  None. FINDINGS: Feeding tube is in place with the tip projecting in the midbody of the stomach. Gaseous distention of small bowel noted. IMPRESSION: Feeding tube tip projects in the midbody of the stomach. Gaseous distention of small bowel. Electronically Signed   By: Drusilla Kanner M.D.   On: 02/07/2016 10:44      Assessment & Plan: Pt is a 64 y.o. male with a PMHx of diabetes mellitus type 2, hypertension, dysphagia, history of ischemic left MCA stroke, obesity, who was admitted to [facility] on 01/31/2016 for ongoing medical management status post recent ischemic left MCA stroke, dysphasia.  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  On 01/24/2016 unfortunately he suffered a left MCA distribution CVA with hemorrhage in the left basal ganglia.  We are asked now to see him for acute renal failure.  1.  Acute renal failure. 2.  Hyperkalemia. 3.  Metabolic acidosis. 4.  Hypotension requiring pressors.  Plan:  The patient is critically  ill at this point in time. As above the patient had recent ischemic left MCA stroke and was subsequently transferred here for care. We are asked to now evaluate him for acute renal failure. Patient's baseline creatinine appears to be 1.2 from the outside hospital. In addition to the acute renal failure now he has hyperkalemia and severe metabolic acidosis in the setting of hypotension. Given the instability of his condition now we recommend that continuous renal placement therapy be considered. Unfortunately we do not have the capacity to perform this at our current institution therefore we recommend that he be transferred to [facility] for consideration of continuous renal placement therapy. Dr. Sharyon Medicus will be discussing the case with pulmonary/critical care who will assess the patient and hopefully except for transfer. Thanks for consultation.

## 2016-02-07 NOTE — Progress Notes (Signed)
Critical results given to Scotty CourtBryce Shepard RN

## 2016-02-08 ENCOUNTER — Inpatient Hospital Stay (HOSPITAL_COMMUNITY): Payer: Managed Care, Other (non HMO)

## 2016-02-08 DIAGNOSIS — N179 Acute kidney failure, unspecified: Secondary | ICD-10-CM

## 2016-02-08 DIAGNOSIS — L8990 Pressure ulcer of unspecified site, unspecified stage: Secondary | ICD-10-CM | POA: Insufficient documentation

## 2016-02-08 LAB — BLOOD CULTURE ID PANEL (REFLEXED)
ACINETOBACTER BAUMANNII: NOT DETECTED
CANDIDA GLABRATA: NOT DETECTED
CANDIDA KRUSEI: NOT DETECTED
CANDIDA PARAPSILOSIS: NOT DETECTED
Candida albicans: NOT DETECTED
Candida tropicalis: NOT DETECTED
Carbapenem resistance: NOT DETECTED
ENTEROBACTERIACEAE SPECIES: NOT DETECTED
ESCHERICHIA COLI: NOT DETECTED
Enterobacter cloacae complex: NOT DETECTED
Enterococcus species: NOT DETECTED
Haemophilus influenzae: NOT DETECTED
KLEBSIELLA OXYTOCA: NOT DETECTED
KLEBSIELLA PNEUMONIAE: NOT DETECTED
Listeria monocytogenes: NOT DETECTED
Methicillin resistance: DETECTED — AB
NEISSERIA MENINGITIDIS: NOT DETECTED
PSEUDOMONAS AERUGINOSA: NOT DETECTED
Proteus species: NOT DETECTED
STREPTOCOCCUS AGALACTIAE: NOT DETECTED
STREPTOCOCCUS PNEUMONIAE: NOT DETECTED
STREPTOCOCCUS SPECIES: NOT DETECTED
Serratia marcescens: NOT DETECTED
Staphylococcus aureus (BCID): NOT DETECTED
Staphylococcus species: DETECTED — AB
Streptococcus pyogenes: NOT DETECTED
Vancomycin resistance: NOT DETECTED

## 2016-02-08 LAB — POCT ACTIVATED CLOTTING TIME
ACTIVATED CLOTTING TIME: 142 s
ACTIVATED CLOTTING TIME: 142 s
ACTIVATED CLOTTING TIME: 158 s
ACTIVATED CLOTTING TIME: 158 s
ACTIVATED CLOTTING TIME: 169 s
ACTIVATED CLOTTING TIME: 175 s
ACTIVATED CLOTTING TIME: 180 s
ACTIVATED CLOTTING TIME: 186 s
ACTIVATED CLOTTING TIME: 186 s
ACTIVATED CLOTTING TIME: 197 s
ACTIVATED CLOTTING TIME: 197 s
ACTIVATED CLOTTING TIME: 202 s
Activated Clotting Time: 158 seconds
Activated Clotting Time: 147 seconds
Activated Clotting Time: 180 seconds
Activated Clotting Time: 186 seconds
Activated Clotting Time: 191 seconds
Activated Clotting Time: 186 seconds
Activated Clotting Time: 197 seconds

## 2016-02-08 LAB — BASIC METABOLIC PANEL
ANION GAP: 18 — AB (ref 5–15)
ANION GAP: 17 — AB (ref 5–15)
BUN: 107 mg/dL — ABNORMAL HIGH (ref 6–20)
BUN: 72 mg/dL — ABNORMAL HIGH (ref 6–20)
CALCIUM: 8.1 mg/dL — AB (ref 8.9–10.3)
CHLORIDE: 99 mmol/L — AB (ref 101–111)
CO2: 26 mmol/L (ref 22–32)
CO2: 29 mmol/L (ref 22–32)
Calcium: 8.9 mg/dL (ref 8.9–10.3)
Chloride: 94 mmol/L — ABNORMAL LOW (ref 101–111)
Creatinine, Ser: 5.35 mg/dL — ABNORMAL HIGH (ref 0.61–1.24)
Creatinine, Ser: 4.02 mg/dL — ABNORMAL HIGH (ref 0.61–1.24)
GFR calc Af Amer: 16 mL/min — ABNORMAL LOW (ref >60)
GFR calc non Af Amer: 10 mL/min — ABNORMAL LOW (ref >60)
GFR calc non Af Amer: 14 mL/min — ABNORMAL LOW (ref >60)
GFR, EST AFRICAN AMERICAN: 12 mL/min — AB (ref >60)
GLUCOSE: 165 mg/dL — AB (ref 65–99)
Glucose, Bld: 191 mg/dL — ABNORMAL HIGH (ref 65–99)
POTASSIUM: 4 mmol/L (ref 3.5–5.1)
Potassium: 3.6 mmol/L (ref 3.5–5.1)
SODIUM: 143 mmol/L (ref 135–145)
Sodium: 140 mmol/L (ref 135–145)

## 2016-02-08 LAB — BLOOD GAS, ARTERIAL
Acid-Base Excess: 4.3 mmol/L — ABNORMAL HIGH (ref 0.0–2.0)
BICARBONATE: 26.6 meq/L — AB (ref 20.0–24.0)
Drawn by: 362771
FIO2: 0.21
O2 Saturation: 96.7 %
PCO2 ART: 28.6 mmHg — AB (ref 35.0–45.0)
PH ART: 7.575 — AB (ref 7.350–7.450)
Patient temperature: 98.6
TCO2: 27.5 mmol/L (ref 0–100)
pO2, Arterial: 77.7 mmHg — ABNORMAL LOW (ref 80.0–100.0)

## 2016-02-08 LAB — POCT I-STAT 3, ART BLOOD GAS (G3+)
ACID-BASE DEFICIT: 8 mmol/L — AB (ref 0.0–2.0)
Bicarbonate: 13.6 mEq/L — ABNORMAL LOW (ref 20.0–24.0)
O2 SAT: 99 %
PCO2 ART: 17.9 mmHg — AB (ref 35.0–45.0)
PH ART: 7.487 — AB (ref 7.350–7.450)
Patient temperature: 98.6
TCO2: 14 mmol/L (ref 0–100)
pO2, Arterial: 128 mmHg — ABNORMAL HIGH (ref 80.0–100.0)

## 2016-02-08 LAB — GLUCOSE, CAPILLARY
GLUCOSE-CAPILLARY: 185 mg/dL — AB (ref 65–99)
GLUCOSE-CAPILLARY: 164 mg/dL — AB (ref 65–99)
GLUCOSE-CAPILLARY: 201 mg/dL — AB (ref 65–99)
Glucose-Capillary: 133 mg/dL — ABNORMAL HIGH (ref 65–99)
Glucose-Capillary: 143 mg/dL — ABNORMAL HIGH (ref 65–99)
Glucose-Capillary: 170 mg/dL — ABNORMAL HIGH (ref 65–99)

## 2016-02-08 LAB — RENAL FUNCTION PANEL
ALBUMIN: 2.3 g/dL — AB (ref 3.5–5.0)
ANION GAP: 13 (ref 5–15)
BUN: 63 mg/dL — ABNORMAL HIGH (ref 6–20)
CALCIUM: 7.6 mg/dL — AB (ref 8.9–10.3)
CO2: 26 mmol/L (ref 22–32)
CREATININE: 3.47 mg/dL — AB (ref 0.61–1.24)
Chloride: 100 mmol/L — ABNORMAL LOW (ref 101–111)
GFR calc non Af Amer: 17 mL/min — ABNORMAL LOW (ref >60)
GFR, EST AFRICAN AMERICAN: 20 mL/min — AB (ref >60)
GLUCOSE: 165 mg/dL — AB (ref 65–99)
PHOSPHORUS: 5.7 mg/dL — AB (ref 2.5–4.6)
Potassium: 3.9 mmol/L (ref 3.5–5.1)
SODIUM: 139 mmol/L (ref 135–145)

## 2016-02-08 LAB — CBC
HCT: 32.4 % — ABNORMAL LOW (ref 39.0–52.0)
HEMOGLOBIN: 10.9 g/dL — AB (ref 13.0–17.0)
MCH: 33 pg (ref 26.0–34.0)
MCHC: 33.6 g/dL (ref 30.0–36.0)
MCV: 98.2 fL (ref 78.0–100.0)
Platelets: 259 10*3/uL (ref 150–400)
RBC: 3.3 MIL/uL — AB (ref 4.22–5.81)
RDW: 14.3 % (ref 11.5–15.5)
WBC: 15.1 10*3/uL — ABNORMAL HIGH (ref 4.0–10.5)

## 2016-02-08 LAB — PROCALCITONIN: PROCALCITONIN: 1.1 ng/mL

## 2016-02-08 LAB — MAGNESIUM: MAGNESIUM: 2 mg/dL (ref 1.7–2.4)

## 2016-02-08 LAB — TROPONIN I: Troponin I: 0.06 ng/mL (ref <0.03)

## 2016-02-08 LAB — APTT: APTT: 40 s — AB (ref 24–36)

## 2016-02-08 LAB — AMMONIA: AMMONIA: 17 umol/L (ref 9–35)

## 2016-02-08 LAB — PHOSPHORUS: Phosphorus: 6.5 mg/dL — ABNORMAL HIGH (ref 2.5–4.6)

## 2016-02-08 MED ORDER — VASOPRESSIN 20 UNIT/ML IV SOLN
0.0300 [IU]/min | INTRAVENOUS | Status: DC
  Administered 2016-02-08 – 2016-02-09 (×2): 0.03 [IU]/min via INTRAVENOUS
  Filled 2016-02-08: qty 2

## 2016-02-08 MED ORDER — PRISMASOL BGK 4/2.5 32-4-2.5 MEQ/L IV SOLN
INTRAVENOUS | Status: DC
  Administered 2016-02-08 – 2016-02-09 (×4): via INTRAVENOUS_CENTRAL
  Filled 2016-02-08 (×13): qty 5000

## 2016-02-08 MED ORDER — NOREPINEPHRINE BITARTRATE 1 MG/ML IV SOLN
5.0000 ug/min | INTRAVENOUS | Status: DC
  Administered 2016-02-08: 25 ug/min via INTRAVENOUS
  Administered 2016-02-09: 7 ug/min via INTRAVENOUS
  Filled 2016-02-08: qty 16

## 2016-02-08 MED ORDER — PRISMASOL BGK 4/2.5 32-4-2.5 MEQ/L IV SOLN
INTRAVENOUS | Status: DC
  Administered 2016-02-08 – 2016-02-09 (×2): via INTRAVENOUS_CENTRAL
  Filled 2016-02-08 (×3): qty 5000

## 2016-02-08 MED ORDER — VANCOMYCIN HCL IN DEXTROSE 1-5 GM/200ML-% IV SOLN
1000.0000 mg | INTRAVENOUS | Status: DC
  Filled 2016-02-08: qty 200

## 2016-02-08 MED ORDER — VITAL HIGH PROTEIN PO LIQD: 1000.0000 mL | ORAL | Status: DC

## 2016-02-08 MED ORDER — INSULIN ASPART 100 UNIT/ML ~~LOC~~ SOLN
0.0000 [IU] | SUBCUTANEOUS | Status: DC
  Administered 2016-02-08: 3 [IU] via SUBCUTANEOUS
  Administered 2016-02-09: 1 [IU] via SUBCUTANEOUS
  Administered 2016-02-09: 2 [IU] via SUBCUTANEOUS
  Administered 2016-02-09: 3 [IU] via SUBCUTANEOUS
  Administered 2016-02-09 (×2): 2 [IU] via SUBCUTANEOUS
  Administered 2016-02-09: 3 [IU] via SUBCUTANEOUS
  Administered 2016-02-10: 1 [IU] via SUBCUTANEOUS
  Administered 2016-02-10 (×2): 2 [IU] via SUBCUTANEOUS
  Administered 2016-02-10: 1 [IU] via SUBCUTANEOUS

## 2016-02-08 MED ORDER — HEPARIN SODIUM (PORCINE) 1000 UNIT/ML DIALYSIS
1000.0000 [IU] | INTRAMUSCULAR | Status: DC | PRN
  Administered 2016-02-08 – 2016-02-09 (×2): 2400 [IU] via INTRAVENOUS_CENTRAL
  Filled 2016-02-08 (×2): qty 6
  Filled 2016-02-08: qty 3
  Filled 2016-02-08: qty 6

## 2016-02-08 MED ORDER — PRISMASOL BGK 0/2.5 32-2.5 MEQ/L IV SOLN: INTRAVENOUS | Status: DC

## 2016-02-08 MED ORDER — HYDROCORTISONE NA SUCCINATE PF 100 MG IJ SOLR
50.0000 mg | Freq: Four times a day (QID) | INTRAMUSCULAR | Status: DC
  Administered 2016-02-08 – 2016-02-10 (×9): 50 mg via INTRAVENOUS
  Filled 2016-02-08: qty 1
  Filled 2016-02-08: qty 2
  Filled 2016-02-08 (×3): qty 1
  Filled 2016-02-08: qty 2
  Filled 2016-02-08: qty 1
  Filled 2016-02-08 (×3): qty 2
  Filled 2016-02-08 (×3): qty 1

## 2016-02-08 MED ORDER — VANCOMYCIN HCL 10 G IV SOLR
1750.0000 mg | Freq: Once | INTRAVENOUS | Status: AC
  Administered 2016-02-08: 1750 mg via INTRAVENOUS
  Filled 2016-02-08: qty 1750

## 2016-02-08 MED ORDER — SODIUM CHLORIDE 0.9 % IV SOLN
INTRAVENOUS | Status: AC
  Administered 2016-02-08 (×2): via INTRAVENOUS

## 2016-02-08 MED ORDER — VITAL AF 1.2 CAL PO LIQD
1000.0000 mL | ORAL | Status: DC
  Administered 2016-02-08 – 2016-02-09 (×2): 1000 mL

## 2016-02-08 NOTE — Progress Notes (Signed)
PHARMACY - PHYSICIAN COMMUNICATION CRITICAL VALUE ALERT - BLOOD CULTURE IDENTIFICATION (BCID)  Results for orders placed or performed during the hospital encounter of 01/31/16  Blood Culture ID Panel (Reflexed) (Collected: 02/07/2016  9:25 PM)  Result Value Ref Range   Enterococcus species NOT DETECTED NOT DETECTED   Vancomycin resistance NOT DETECTED NOT DETECTED   Listeria monocytogenes NOT DETECTED NOT DETECTED   Staphylococcus species DETECTED (A) NOT DETECTED   Staphylococcus aureus NOT DETECTED NOT DETECTED   Methicillin resistance DETECTED (A) NOT DETECTED   Streptococcus species NOT DETECTED NOT DETECTED   Streptococcus agalactiae NOT DETECTED NOT DETECTED   Streptococcus pneumoniae NOT DETECTED NOT DETECTED   Streptococcus pyogenes NOT DETECTED NOT DETECTED   Acinetobacter baumannii NOT DETECTED NOT DETECTED   Enterobacteriaceae species NOT DETECTED NOT DETECTED   Enterobacter cloacae complex NOT DETECTED NOT DETECTED   Escherichia coli NOT DETECTED NOT DETECTED   Klebsiella oxytoca NOT DETECTED NOT DETECTED   Klebsiella pneumoniae NOT DETECTED NOT DETECTED   Proteus species NOT DETECTED NOT DETECTED   Serratia marcescens NOT DETECTED NOT DETECTED   Carbapenem resistance NOT DETECTED NOT DETECTED   Haemophilus influenzae NOT DETECTED NOT DETECTED   Neisseria meningitidis NOT DETECTED NOT DETECTED   Pseudomonas aeruginosa NOT DETECTED NOT DETECTED   Candida albicans NOT DETECTED NOT DETECTED   Candida glabrata NOT DETECTED NOT DETECTED   Candida krusei NOT DETECTED NOT DETECTED   Candida parapsilosis NOT DETECTED NOT DETECTED   Candida tropicalis NOT DETECTED NOT DETECTED    Name of physician (or Provider) Contacted: Sommer  Changes to prescribed antibiotics required: none, continue on Zosyn and vancomycin.   Pollyann SamplesAndy Richetta Cubillos, PharmD, BCPS 02/08/2016, 9:15 PM Pager: [phone]

## 2016-02-08 NOTE — Progress Notes (Signed)
Pharmacy Antibiotic Note  Brett Jenkins is a 65 y.o. male admitted on 02/07/2016 with sepsis.  Pharmacy has been consulted for vancomycin dosing. Patient is currently on CRRT due to decline in renal function.   Plan: Vancomycin Load 1750 mg X 1 then Vancomycin 1 gm Q 24  Goal trough : 15-20[facility], cultures   Height: 5' 6.5" (168.9 cm) (from 08/19/15 encounter) Weight: 199 lb 11.8 oz (90.6 kg) IBW/kg (Calculated) : 64.95  Temp (24hrs), Avg:98.6 F (37 C), Min:97.5 F (36.4 C), Max:99.4 F (37.4 C)   Recent Labs Lab 02/05/16 0619  02/05/16 2243 02/06/16 0801 02/07/16 0536 02/07/16 1013 02/07/16 2112 02/08/16 0409  WBC 10.1  --   --   --  17.7* 16.3*  --  15.1*  CREATININE 3.26*  < > 3.89* 4.59* 6.46* 6.79*  --  5.35*  LATICACIDVEN  --   --   --   --   --   --  1.2  --   < > = values in this interval not displayed.  Estimated Creatinine Clearance (by C-G formula based on SCr of 5.35 mg/dL) Male: 16.111.7 mL/min Male: 14.3 mL/min    No Known Allergies  Antimicrobials this admission: Zosyn 8/7 >>  Vanc 8/8 >>     Microbiology results: 8/7  BCx: Pending 8/7  MRSA PCR: Positive   Thank you for allowing pharmacy to be a part of this patient's care.  Delila SpenceEmily A Stewart, Pharm D Pharmacy Resident [phone] 02/08/2016 11:00 AM

## 2016-02-08 NOTE — Progress Notes (Signed)
RT attempted to place arterial line and was not successful. Two RT's attempted with 3 total sticks. Able to get blood return but unable to thread. RT will notify MD. RN aware.

## 2016-02-08 NOTE — Care Management Note (Addendum)
Case Management Note  Patient Details  Name: Brett Jenkins MRN: [id number] Date of Birth: 03/22/1951  Subjective/Objective:       [facility] by worsening renal failure despite IVF hydration. Creatinine was initially 3.26 and has risen to 6.79. He also developed hyperkalemia and hyponatremia. WBC rose as well so he was started on broad spectrum antibiotics. 8/7 his mental status started to deteriorate and he developed profound acidemia with AKI and [facility]               Action/Plan:   Expected Discharge Date:                  Expected Discharge Plan:  Long Term Acute Care (LTAC)  In-House Referral:     Discharge planning Services  CM Consult  Post Acute Care Choice:    Choice offered to:     DME Arranged:    DME Agency:     HH Arranged:    HH Agency:     Status of Service:  In process, will continue to follow  If discussed at Long Length of Stay Meetings, dates discussed:    Additional Comments: CM spoke in detail with wife and son - they expect pt to discharge back to [facility].  [facility] liason confirmed that pt will discharge back to facility - per attending possibly 02/10/16 Cherylann Parrlaxton, Sloane Junkin S, RN 02/08/2016, 1:28 PM

## 2016-02-08 NOTE — Procedures (Signed)
Arterial Catheter Insertion Procedure Note Brett Jenkins [id number] 12/25/1950  Procedure: Insertion of Arterial Catheter  Indications: Blood pressure monitoring and Frequent blood sampling  Procedure Details Consent: Risks of procedure as well as the alternatives and risks of each were explained to the (patient/caregiver).  Consent for procedure obtained. Time Out: Verified patient identification, verified procedure, site/side was marked, verified correct patient position, special equipment/implants available, medications/allergies/relevent history reviewed, required imaging and test results available.  Performed Real time US was used to ID and cannulate vessel  Maximum sterile technique was used including antiseptics, cap, gloves, gown, hand hygiene, mask and sheet. Skin prep: Chlorhexidine; local anesthetic administered 20 gauge catheter was inserted into left radial artery using the Seldinger technique.  Evaluation Blood flow good; BP tracing good. Complications: No apparent complications.   Brett Jenkins 02/08/2016 Brett MartinetPeter E Andrell Jenkins ACNP-BC [facility] Pager # [phone] OR # [phone] if no answer

## 2016-02-08 NOTE — Progress Notes (Signed)
PULMONARY / CRITICAL CARE MEDICINE   Name:           Brett Jenkins MRN:             [id number] DOB:             01/17/1951                       ADMISSION DATE:  01/31/2016 CONSULTATION DATE:  8/7  REFERRING MD:  D. Hijazi  CHIEF COMPLAINT:  AMS  HISTORY OF PRESENT ILLNESS: Patient is encephalopathic and/or intubated. Therefore history has been obtained from chart review.  65 year old male who [facility] L sided CVA. He underwent embolectomy of L MCA. Angioplasty of the internal carotid was unsuccessful. 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 After extubation [facility] by worsening renal failure despite IVF hydration. Creatinine was initially 3.26 and has risen to 6.79. He also developed hyperkalemia and hyponatremia. WBC rose as well so he was started on broad spectrum antibiotics. 8/7 his mental status started to deteriorate and he developed profound acidemia with AKI   SUBJECTIVE:  Remains critically ill and poorly responsive Now on CRRT Afebrile   VITAL SIGNS: Vitals:   02/08/16 1100 02/08/16 1200  BP: 104/78 (!) 77/43  Pulse: 91 94  Resp: (!) 22 18  Temp:       HEMODYNAMICS:       INTAKE / OUTPUT: I/O last 3 completed shifts: In: 961 [I.V.:661; Other:40; NG/GT:60; IV Piggyback:200] Out: 1384 [Urine:50; Emesis/NG output:350; WUJWJ:191Other:284; Stool:700] Total I/O In: 699.8 [I.V.:399.8; IV Piggyback:300] Out: 253 [Other:253]     PHYSICAL EXAMINATION: General:  Obese male in NAD Neuro:  Obtunded, Does not respond to pain HEENT:  [place]/AT, PERRL, no JVD. NGT in place Cardiovascular:  IRIR, rate controlled. No MRG Lungs:  Clear bilateral  breath sounds Abdomen:  Soft, non-distended. Longitudinal surgical scar (recent with steri strips in place), Ileostomy draining large volume dark green stool.  Musculoskeletal:  No acute deformity Skin:  Grossly intact  LABS:  BMET  Last Labs    Recent Labs Lab 02/06/16 0801 02/06/16 1502 02/07/16 0536 02/07/16 1013  NA 154*  --  148* 145  K 6.0* 5.8* 6.1* 6.0*  CL >130*  --  124* 123*  CO2 9*  --  9* 8*  BUN 116*  --  127* 133*  CREATININE 4.59*  --  6.46* 6.79*  GLUCOSE 151*  --  151* 169*      Electrolytes  Last Labs    Recent Labs Lab 02/06/16 0801 02/07/16 0536 02/07/16 1013  CALCIUM 10.3 10.8* 9.9      CBC  Last Labs    Recent Labs Lab 02/05/16 0619 02/07/16 0536 02/07/16 1013  WBC 10.1 17.7* 16.3*  HGB 11.3* 13.2 10.4*  HCT 35.5* 41.4 33.7*  PLT 306 278 276      Coag's Last Labs   No results for input(s): APTT, INR in the last 168 hours.    Sepsis Markers Last Labs   No results for input(s): LATICACIDVEN, PROCALCITON, O2SATVEN in the last 168 hours.    ABG  Last Labs    Recent Labs Lab 02/07/16 0926  PHART 7.144*  PCO2ART 18.9*  PO2ART  119*      Liver Enzymes  Last Labs    Recent Labs Lab 01/31/16 1809  AST 32  ALT 58  ALKPHOS 146*  BILITOT 0.5  ALBUMIN 3.0*      Cardiac Enzymes Last Labs   No results for input(s): TROPONINI, PROBNP in the last 168 hours.    Glucose Last Labs   No results for input(s): GLUCAP in the last 168 hours.    Imaging pCXR - right IJ HD catheter in position, no infiltrates or effusions     STUDIES:  CT abd /pelvis 8/7 >>Postoperative changes of recent total colectomy and right lower quadrant ileostomy Renal US 8/7 >> nml kidneys  CULTURES: 8/7 BCx2 8/7 Urine Cx  ANTIBIOTICS: Zosyn 8/7 >>> Vancomycin 8/7 >>>  SIGNIFICANT EVENTS: 8/7 [facility] for renal failure and AMS  LINES/TUBES: RIJ HD cath 8/7  >>   DISCUSSION: 65 year old male with recent stroke with global aphasia and R sided weakness. Also with recent bowel ischemia s/p colectomy and ileostomy. [facility] from [facility] 7/31 for further rehab efforts. [facility] course complicated by renal failure and AMS. Transferred to ICU with plans for hydration, Bicarb gtt, pressors, likely intubation and CRRT.   ASSESSMENT / PLAN:  PULMONARY A: Inability to protect airway  P:   May need to Intubate for airway protection if mental status worsens Meanwhile monitor on oxygen  CARDIOVASCULAR A:  Shock, hypovolemic vs sepsis  P:  Telemetry monitoring Levophed + vaso gtt for MAP goal > 65 Await echo    RENAL A:   Acute renal failure (Creatinine 3.26 > 6.79) Hypernatremia Hyperkalemia  P:   Consult nephrology Ct CRRT  Treat electrolyte abnormalities as indicated.   GASTROINTESTINAL A:   Ileostomy status Nutrition  P:   NPO Trickle feeds Pepcid daily for SUP in setting renal failure  HEMATOLOGIC A:   Anemia   P:  Follow CBC Transfuse per ICU guidelines  INFECTIOUS A:   Septic shock ? source  P:   Broad spectrum ABX as above Assess PCT Follow cultures  ENDOCRINE A:   DM  P:   CBG monitoring and SSI q 4 hours.   NEUROLOGIC A:   Acute metabolic encephalopathy in setting uremia and profound acidosis L sided CVA with global aphasia  P:   RASS goal: 0 PRN fentanyl if needed    FAMILY  - Updates:   wife and son at bedside 8/8  - Inter-disciplinary family meet or Palliative Care meeting due by:  8/14    The patient is critically ill with multiple organ systems failure and requires high complexity decision making for assessment and support, frequent evaluation and titration of therapies, application of advanced monitoring technologies and extensive interpretation of multiple databases. Critical Care Time devoted to patient care services described in this note independent of  APP time is 35 minutes.    Cyril Mourning MD. Tonny Bollman. [place] [facility] & [facility] Pager [phone] If no response call [phone]   02/08/2016

## 2016-02-08 NOTE — Progress Notes (Signed)
Assessment/Plan: 1.  ARF- in setting of hypotension and concomitant ACE-inhibitor, diuretic, and metformin.   1.  Needs volume.  Will give saline volume expansion. 2. Hyperkalemia- due to #1, resolved 3. Metabolic acidosis due to ARF and metformin use, now alkalotic.   1. Stop bicarb 4. Severe sepsis syndrome- unclear etiology but [facility].  Pan culture, antibiotics per PCCM 5. HTN- off meds for now in light of sepsis. In shock  Objective: Vital signs in last 24 hours: Temp:  [97.5 F (36.4 C)-99.4 F (37.4 C)] 97.5 F (36.4 C) (08/08 0330) Pulse Rate:  [68-121] 95 (08/08 0730) Resp:  [11-42] 20 (08/08 0730) BP: (58-209)/(18-197) 91/46 (08/08 0730) SpO2:  [93 %-100 %] 98 % (08/08 0730) Weight:  [90.6 kg (199 lb 11.8 oz)] 90.6 kg (199 lb 11.8 oz) (08/08 0400) Weight change:   Intake/Output from previous day: 08/07 0701 - 08/08 0700 In: 961 [I.V.:661; NG/GT:60; IV Piggyback:200] Out: 1384 [Urine:50; Emesis/NG output:350; Stool:700] Intake/Output this shift: No intake/output data recorded.  General appearance: wearing oxygen, arouses some, temporal wasting Head: Normocephalic, without obvious abnormality, atraumatic Skin: markedly increase tenting of skin Abd ileostomy bag,scaphoid  Lab Results:  Recent Labs  02/07/16 1013 02/08/16 0409  WBC 16.3* 15.1*  HGB 10.4* 10.9*  HCT 33.7* 32.4*  PLT 276 259   BMET:  Recent Labs  02/07/16 1013 02/08/16 0409  NA 145 143  K 6.0* 4.0  CL 123* 99*  CO2 8* 26  GLUCOSE 169* 191*  BUN 133* 107*  CREATININE 6.79* 5.35*  CALCIUM 9.9 8.9   No results for input(s): PTH in the last 72 hours. Iron Studies: No results for input(s): IRON, TIBC, TRANSFERRIN, FERRITIN in the last 72 hours. Studies/Results: [place] Renal  Result Date: 02/07/2016 CLINICAL DATA:  Acute onset of renal failure. Septic shock. Initial encounter. EXAM: RENAL / URINARY TRACT ULTRASOUND COMPLETE COMPARISON:  CT of the abdomen and pelvis  performed earlier today at 9:48 p.m. FINDINGS: Right Kidney: Length: 11.5 cm. Echogenicity within normal limits. No mass or hydronephrosis visualized. Left Kidney: Length: 10.6 cm. Echogenicity within normal limits. No mass or hydronephrosis visualized. Bladder: Decompressed, with a Foley catheter in place. IMPRESSION: Unremarkable renal ultrasound.  No evidence of hydronephrosis. Electronically Signed   By: Roanna RaiderJeffery  Chang M.D.   On: 02/07/2016 22:48   Dg Chest Port 1 View  Result Date: 02/07/2016 CLINICAL DATA:  Catheter placement. EXAM: PORTABLE CHEST 1 VIEW COMPARISON:  None. FINDINGS: The heart size and mediastinal contours are within normal limits. Both lungs are clear. No pneumothorax or pleural effusion is noted. Nasogastric tube is seen entering stomach. Right internal jugular catheter is noted with distal tip in expected position of SVC. The visualized skeletal structures are unremarkable. IMPRESSION: Right internal jugular catheter tip seen in expected position of the SVC. No pneumothorax is noted. Electronically Signed   By: Lupita RaiderJames  Green Jr, M.D.   On: 02/07/2016 18:44   Dg Abd Portable 1v  Result Date: 02/07/2016 CLINICAL DATA:  Feeding tube placement. EXAM: PORTABLE ABDOMEN - 1 VIEW COMPARISON:  None. FINDINGS: Feeding tube is in place with the tip projecting in the midbody of the stomach. Gaseous distention of small bowel noted. IMPRESSION: Feeding tube tip projects in the midbody of the stomach. Gaseous distention of small bowel. Electronically Signed   By: Drusilla Kannerhomas  Dalessio M.D.   On: 02/07/2016 10:44    Scheduled: . antiseptic oral rinse  7 mL Mouth Rinse q12n4p  . chlorhexidine  15 mL  Mouth Rinse BID  . Chlorhexidine Gluconate Cloth  6 each Topical Q0600  . famotidine (PEPCID) IV  20 mg Intravenous Q12H  . mupirocin ointment  1 application Nasal BID  . piperacillin-tazobactam  3.375 g Intravenous Q6H     LOS: 1 day   Zymiere Trostle C 02/08/2016,8:06 AM

## 2016-02-08 NOTE — Progress Notes (Signed)
eLink Physician-Brief Progress Note Patient Name: Neal DyDallas F Corne DOB: 01/03/1951 MRN: [id number]   Date of Service  02/08/2016  HPI/Events of Note  Blood glucose = 201.   eICU Interventions  Will order Q 4 hour sensitive Novolog SSI.        Sommer,Steven Dennard Nipugene 02/08/2016, 9:33 PM

## 2016-02-08 NOTE — Progress Notes (Signed)
Notified MD Resident Vishal from Nephrology in regards to patient possibly over correcting labs from metabolic acidosis to alkalosis.

## 2016-02-08 NOTE — Progress Notes (Signed)
Initial Nutrition Assessment  DOCUMENTATION CODES:   Obesity unspecified  INTERVENTION:   -Initiate TF via NGT with Vital AF 1.2 at goal rate of 75 ml/h (1800 ml per day) to provide 2160 kcals, 135 gm protein, 1460 ml free water daily.  NUTRITION DIAGNOSIS:   Inadequate oral intake related to inability to eat as evidenced by NPO status.  GOAL:   Patient will meet greater than or equal to 90% of their needs  MONITOR:   Labs, Weight trends, TF tolerance, Skin, I & O's  REASON FOR ASSESSMENT:   Consult Enteral/tube feeding initiation and management  ASSESSMENT:   65 year old male with recent stroke with now global aphasia and R sided weakness. Also with recent bowel ischemia s/p colectomy and ileostomy. [facility] failure and AMS. Transferred to ICU with plans for hydration, Bicarb gtt, pressors, likely intubation and CRRT.   Pt admitted with AMS, rt sided weakness, and ARF.   No family at bedside. Pt unable to provide hx.   [facility] Per RD notes, previous diet order was dysphagia 1 diet with nectar thick liquids. Pt was receiving 30 ml Prostat TID and Magic Cup TID with meals.   Case discussed with RN. Pt currently on CRRT. Plan to start TF today. RN confirmed that NGT was placed at [facility]. Per KUB, tip of tube placement confirmed in the stomach.   Nutrition-Focused physical exam completed. Findings are no fat depletion, mild muscle depletion (on lower extremities only, likely related to limited mobility), and mild edema.   Labs reviewed: Phos: 6.5.  Diet Order:  Diet NPO time specified  Skin:  Wound (see comment) (stage II sacrum)  Last BM:  02/07/16  Height:   Ht Readings from Last 1 Encounters:  02/08/16 5' 6.5" (1.689 m)    Weight:   Wt Readings from Last 1 Encounters:  02/08/16 199 lb 11.8 oz (90.6 kg)    Ideal Body Weight:  65.9  kg  BMI:  Body mass index is 31.76 kg/m.  Estimated Nutritional Needs:   Kcal:  2000-2200  Protein:  130-145 grams  Fluid:  > 2 L  EDUCATION NEEDS:   No education needs identified at this time  Mate Alegria A. Mayford KnifeWilliams, RD, LDN, CDE Pager: [phone] After hours Pager: [phone]

## 2016-02-09 ENCOUNTER — Inpatient Hospital Stay (HOSPITAL_COMMUNITY): Payer: Managed Care, Other (non HMO)

## 2016-02-09 DIAGNOSIS — N179 Acute kidney failure, unspecified: Secondary | ICD-10-CM

## 2016-02-09 LAB — ECHOCARDIOGRAM COMPLETE
HEIGHTINCHES: 66.5 in
WEIGHTICAEL: 3308.66 [oz_av]

## 2016-02-09 LAB — POCT ACTIVATED CLOTTING TIME
ACTIVATED CLOTTING TIME: 180 s
ACTIVATED CLOTTING TIME: 197 s
ACTIVATED CLOTTING TIME: 191 s
ACTIVATED CLOTTING TIME: 197 s

## 2016-02-09 LAB — GLUCOSE, CAPILLARY
GLUCOSE-CAPILLARY: 170 mg/dL — AB (ref 65–99)
GLUCOSE-CAPILLARY: 171 mg/dL — AB (ref 65–99)
GLUCOSE-CAPILLARY: 222 mg/dL — AB (ref 65–99)
Glucose-Capillary: 193 mg/dL — ABNORMAL HIGH (ref 65–99)
Glucose-Capillary: 209 mg/dL — ABNORMAL HIGH (ref 65–99)
Glucose-Capillary: 148 mg/dL — ABNORMAL HIGH (ref 65–99)

## 2016-02-09 LAB — RENAL FUNCTION PANEL
ALBUMIN: 2.1 g/dL — AB (ref 3.5–5.0)
ANION GAP: 10 (ref 5–15)
Albumin: 2 g/dL — ABNORMAL LOW (ref 3.5–5.0)
Anion gap: 9 (ref 5–15)
BUN: 52 mg/dL — ABNORMAL HIGH (ref 6–20)
BUN: 62 mg/dL — AB (ref 6–20)
CALCIUM: 7.8 mg/dL — AB (ref 8.9–10.3)
CHLORIDE: 105 mmol/L (ref 101–111)
CO2: 24 mmol/L (ref 22–32)
CO2: 23 mmol/L (ref 22–32)
CREATININE: 2.61 mg/dL — AB (ref 0.61–1.24)
CREATININE: 3.01 mg/dL — AB (ref 0.61–1.24)
Calcium: 8.2 mg/dL — ABNORMAL LOW (ref 8.9–10.3)
Chloride: 104 mmol/L (ref 101–111)
GFR calc Af Amer: 23 mL/min — ABNORMAL LOW (ref >60)
GFR calc non Af Amer: 24 mL/min — ABNORMAL LOW (ref >60)
GFR, EST AFRICAN AMERICAN: 28 mL/min — AB (ref >60)
GFR, EST NON AFRICAN AMERICAN: 20 mL/min — AB (ref >60)
GLUCOSE: 138 mg/dL — AB (ref 65–99)
Glucose, Bld: 205 mg/dL — ABNORMAL HIGH (ref 65–99)
PHOSPHORUS: 3.6 mg/dL (ref 2.5–4.6)
POTASSIUM: 3.9 mmol/L (ref 3.5–5.1)
Phosphorus: 3.1 mg/dL (ref 2.5–4.6)
Potassium: 3.9 mmol/L (ref 3.5–5.1)
SODIUM: 138 mmol/L (ref 135–145)
Sodium: 137 mmol/L (ref 135–145)

## 2016-02-09 LAB — MAGNESIUM: Magnesium: 2.1 mg/dL (ref 1.7–2.4)

## 2016-02-09 LAB — PROCALCITONIN: PROCALCITONIN: 0.74 ng/mL

## 2016-02-09 LAB — APTT: APTT: 142 s — AB (ref 24–36)

## 2016-02-09 MED ORDER — FAMOTIDINE IN NACL 20-0.9 MG/50ML-% IV SOLN
20.0000 mg | INTRAVENOUS | Status: DC
  Administered 2016-02-10: 20 mg via INTRAVENOUS
  Filled 2016-02-09: qty 50

## 2016-02-09 MED ORDER — VITAL AF 1.2 CAL PO LIQD
1000.0000 mL | ORAL | Status: DC
  Administered 2016-02-10: 1000 mL

## 2016-02-09 MED ORDER — PIPERACILLIN-TAZOBACTAM IN DEX 2-0.25 GM/50ML IV SOLN
2.2500 g | Freq: Three times a day (TID) | INTRAVENOUS | Status: DC
  Administered 2016-02-09 – 2016-02-10 (×4): 2.25 g via INTRAVENOUS
  Filled 2016-02-09 (×6): qty 50

## 2016-02-09 NOTE — Progress Notes (Signed)
Assessment/Plan: 1. ARF- in setting of hypotension and concomitant ACE-inhibitor, diuretic, and metformin.  1.  Will stop CRRT today 2. Hyperkalemia- due to #1, resolved 3. Met acidosis, resolved 4. Severe sepsis syndrome- unclear etiology, improving 5. HTN- off meds for now in light of sepsis. In shock, improving   Subjective: Interval History: volume expanded with improved hemodynamics  Objective: Vital signs in last 24 hours: Temp:  [97.9 F (36.6 C)-99.4 F (37.4 C)] 99.4 F (37.4 C) (08/09 0824) Pulse Rate:  [56-94] 73 (08/09 0700) Resp:  [11-27] 20 (08/09 0700) BP: (71-146)/(35-129) 111/44 (08/09 0700) SpO2:  [94 %-100 %] 99 % (08/09 0700) Arterial Line BP: (89-174)/(33-58) 141/47 (08/09 0700) Weight:  [93.8 kg (206 lb 12.7 oz)] 93.8 kg (206 lb 12.7 oz) (08/09 0200) Weight change: 3.2 kg (7 lb 0.9 oz)  Intake/Output from previous day: 08/08 0701 - 08/09 0700 In: 4439.2 [I.V.:2380.4; NG/GT:1208.8; IV Piggyback:800] Out: 2320 [Urine:125; Stool:950] Intake/Output this shift: Total I/O In: 112.9 [I.V.:37.9; NG/GT:75] Out: 3 [Other:3]  General appearance: slowed mentation and responsive GI: ileostomy on right, post op Skin: skin turgor improved  Lab Results:  Recent Labs  02/07/16 1013 02/08/16 0409  WBC 16.3* 15.1*  HGB 10.4* 10.9*  HCT 33.7* 32.4*  PLT 276 259   BMET:  Recent Labs  02/08/16 1621 02/09/16 0330  NA 139 138  K 3.9 3.9  CL 100* 104  CO2 26 24  GLUCOSE 165* 205*  BUN 63* 52*  CREATININE 3.47* 2.61*  CALCIUM 7.6* 7.8*   No results for input(s): PTH in the last 72 hours. Iron Studies: No results for input(s): IRON, TIBC, TRANSFERRIN, FERRITIN in the last 72 hours. Studies/Results: Ct Abdomen Pelvis Wo Contrast  Result Date: 02/08/2016 CLINICAL DATA:  65 year old male with history of diabetes and hypertension with history of recent (01/05/2016) left MCA acute ischemic stroke complicated by right-sided hemiparesis, dysphagia and global  aphasia. Hospitalization complicated by E coli urosepsis, subsequently with development of ischemic colitis which required colectomy on 01/20/2016, as well as the ileostomy. After discharge, patient developed hypotension and progressive renal failure. EXAM: CT ABDOMEN AND PELVIS WITHOUT CONTRAST TECHNIQUE: Multidetector CT imaging of the abdomen and pelvis was performed following the standard protocol without IV contrast. COMPARISON:  No priors. FINDINGS: Lower chest: Atherosclerotic calcifications in the left anterior descending, left circumflex and right coronary arteries atherosclerosis of the distal descending thoracic aorta. Severe calcifications of the inferior aspect of the mitral annulus. Scarring and/or subsegmental atelectasis in the right lower lobe. Nasogastric tube in the esophagus. Hepatobiliary: No definite cystic or solid hepatic lesions are identified on today's noncontrast CT examination. Unenhanced appearance of the gallbladder is normal. Pancreas: No pancreatic mass or peripancreatic inflammatory changes on today's noncontrast CT examination. Spleen: Unremarkable. Adrenals/Urinary Tract: Bilateral adrenal glands are normal in appearance. Left kidney is normal in appearance. Exophytic 1.5 cm low-attenuation lesion in the lower pole of the right kidney is incompletely characterized, but is statistically likely a cyst. No urinary tract calculi. No hydroureteronephrosis. Urinary bladder is completely decompressed around an indwelling Foley catheter. Small volume of gas in the urinary bladder is presumably iatrogenic. Stomach/Bowel: Unenhanced appearance of the stomach is normal. Nasogastric tube extends into the duodenum at the junction of second and third portions. No pathologic dilatation of small bowel. 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 Vascular/Lymphatic: Aortic atherosclerosis, without definite aneurysm in the abdomen or pelvis on today's  noncontrast CT examination. No lymphadenopathy noted in the abdomen or pelvis. Reproductive:  Prostate gland and seminal vesicles are unremarkable in appearance. Other: Trace volume of ascites in the low anatomic pelvis, not unexpected given the patient's recent surgical history. No well-defined organized fluid collection to suggest intra abdominal or pelvic abscess. No pneumoperitoneum. Musculoskeletal: 4 mm of anterolisthesis of L4 upon L5. There are no aggressive appearing lytic or blastic lesions noted in the visualized portions of the skeleton. IMPRESSION: 1. No acute findings in the abdomen or pelvis on today's noncontrast CT examination. 2. Postoperative changes of recent total colectomy and right lower quadrant ileostomy, without acute complicating features. 3. Aortic atherosclerosis, in addition to at least 3 vessel coronary artery disease. Please note that although the presence of coronary artery calcium documents the presence of coronary artery disease, the severity of this disease and any potential stenosis cannot be assessed on this non-gated CT examination. Assessment for potential risk factor modification, dietary therapy or pharmacologic therapy may be warranted, if clinically indicated. 4. There are severe calcifications of the mitral annulus. Echocardiographic correlation for evaluation of potential valvular dysfunction may be warranted if clinically indicated. 5. Additional incidental findings, as above. Electronically Signed   By: Vinnie Langton M.D.   On: 02/08/2016 08:12   Ct Head Wo Contrast  Result Date: 02/08/2016 CLINICAL DATA:  Acute encephalopathy.  Sepsis.  Stroke in July 2017. EXAM: CT HEAD WITHOUT CONTRAST TECHNIQUE: Contiguous axial images were obtained from the base of the skull through the vertex without intravenous contrast. COMPARISON:  None. FINDINGS: There is low density in the white matter of the left cerebral hemisphere in a middle cerebral artery distribution. The  overlying cortex is thin and high density. There is similar high density involving the adjacent caudate. No significant mass effect is seen. No intracranial hemorrhage. The ventricles and subarachnoid spaces are mildly enlarged. Mild patchy white matter low density in both cerebral hemispheres. Atheromatous cavernous carotid artery calcifications are noted. Unremarkable bones. A left maxillary sinus retention cyst is noted. There is also a large oval, circumscribed, low density mass in the subcutaneous fat at the posterior skull base/upper neck on the left. This mass measures 3.3 cm in maximum diameter and has a small track extending to the skin surface. IMPRESSION: 1. Changes compatible with a subacute infarct in a left middle cerebral artery distribution with overlying laminar necrosis. Correlation with any previous outside studies would be helpful. 2. Mild atrophy and mild chronic small vessel white matter ischemic changes in both cerebral hemispheres. 3. Large left posterior sebaceous cyst. Electronically Signed   By: Claudie Revering M.D.   On: 02/08/2016 13:40   US Renal  Result Date: 02/07/2016 CLINICAL DATA:  Acute onset of renal failure. Septic shock. Initial encounter. EXAM: RENAL / URINARY TRACT ULTRASOUND COMPLETE COMPARISON:  CT of the abdomen and pelvis performed earlier today at 9:48 p.m. FINDINGS: Right Kidney: Length: 11.5 cm. Echogenicity within normal limits. No mass or hydronephrosis visualized. Left Kidney: Length: 10.6 cm. Echogenicity within normal limits. No mass or hydronephrosis visualized. Bladder: Decompressed, with a Foley catheter in place. IMPRESSION: Unremarkable renal ultrasound.  No evidence of hydronephrosis. Electronically Signed   By: Garald Balding M.D.   On: 02/07/2016 22:48   Dg Chest Port 1 View  Result Date: 02/07/2016 CLINICAL DATA:  Catheter placement. EXAM: PORTABLE CHEST 1 VIEW COMPARISON:  None. FINDINGS: The heart size and mediastinal contours are within normal  limits. Both lungs are clear. No pneumothorax or pleural effusion is noted. Nasogastric tube is seen entering stomach. Right internal jugular catheter is noted  with distal tip in expected position of SVC. The visualized skeletal structures are unremarkable. IMPRESSION: Right internal jugular catheter tip seen in expected position of the SVC. No pneumothorax is noted. Electronically Signed   By: Marijo Conception, M.D.   On: 02/07/2016 18:44   Dg Abd Portable 1v  Result Date: 02/07/2016 CLINICAL DATA:  Feeding tube placement. EXAM: PORTABLE ABDOMEN - 1 VIEW COMPARISON:  None. FINDINGS: Feeding tube is in place with the tip projecting in the midbody of the stomach. Gaseous distention of small bowel noted. IMPRESSION: Feeding tube tip projects in the midbody of the stomach. Gaseous distention of small bowel. Electronically Signed   By: Inge Rise M.D.   On: 02/07/2016 10:44    Scheduled: . antiseptic oral rinse  7 mL Mouth Rinse q12n4p  . chlorhexidine  15 mL Mouth Rinse BID  . Chlorhexidine Gluconate Cloth  6 each Topical Q0600  . famotidine (PEPCID) IV  20 mg Intravenous Q12H  . hydrocortisone sod succinate (SOLU-CORTEF) inj  50 mg Intravenous Q6H  . insulin aspart  0-9 Units Subcutaneous Q4H  . mupirocin ointment  1 application Nasal BID  . piperacillin-tazobactam  3.375 g Intravenous Q6H  . vancomycin  1,000 mg Intravenous Q24H    LOS: 2 days   Muneeb Veras C 02/09/2016,8:26 AM

## 2016-02-09 NOTE — Progress Notes (Signed)
PULMONARY / CRITICAL CARE MEDICINE   Name:           Brett Jenkins MRN:             [id number] DOB:             08/28/1950                       ADMISSION DATE:  01/31/2016 CONSULTATION DATE:  8/7  REFERRING MD:  D. Hijazi  CHIEF COMPLAINT:  AMS  HISTORY OF PRESENT ILLNESS: Patient is encephalopathic and/or intubated. Therefore history has been obtained from chart review.  65 year old male who was recently [facility] for L sided CVA. He underwent embolectomy of L MCA. Angioplasty of the internal carotid was unsuccessful. 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 After extubation [facility] was [facility]. [facility] by worsening renal failure despite IVF hydration. Creatinine was initially 3.26 and has risen to 6.79. He also developed hyperkalemia and hyponatremia. WBC rose as well so he was started on broad spectrum antibiotics. 8/7 his mental status started to deteriorate and he developed profound acidemia with AKI   SUBJECTIVE:  Remains critically ill and more responsive remains on CRRT Afebrile   VITAL SIGNS: Vitals:   02/09/16 0630 02/09/16 0700  BP:  (!) 111/44  Pulse: 82 73  Resp: (!) 21 20  Temp:       HEMODYNAMICS:       INTAKE / OUTPUT: I/O last 3 completed shifts: In: 5400.2 [I.V.:3041.4; Other:90; NG/GT:1268.8; IV Piggyback:1000] Out: 3704 [Urine:175; Emesis/NG output:350; Other:1529; Stool:1650] No intake/output data recorded.     PHYSICAL EXAMINATION: General:  Obese male in NAD Neuro:  Obtunded, Does not respond to pain HEENT:  [place]/AT, PERRL, no JVD. NGT in place Cardiovascular:  IRIR, rate controlled. No MRG Lungs:  Clear bilateral breath sounds Abdomen:  Soft,  non-distended. Longitudinal surgical scar (recent with steri strips in place), Ileostomy draining large volume dark green stool.  Musculoskeletal:  No acute deformity Skin:  Grossly intact  LABS:  BMET  Last Labs    Recent Labs Lab 02/06/16 0801 02/06/16 1502 02/07/16 0536 02/07/16 1013  NA 154*  --  148* 145  K 6.0* 5.8* 6.1* 6.0*  CL >130*  --  124* 123*  CO2 9*  --  9* 8*  BUN 116*  --  127* 133*  CREATININE 4.59*  --  6.46* 6.79*  GLUCOSE 151*  --  151* 169*      Electrolytes  Last Labs    Recent Labs Lab 02/06/16 0801 02/07/16 0536 02/07/16 1013  CALCIUM 10.3 10.8* 9.9      CBC  Last Labs    Recent Labs Lab 02/05/16 0619 02/07/16 0536 02/07/16 1013  WBC 10.1 17.7* 16.3*  HGB 11.3* 13.2 10.4*  HCT 35.5* 41.4 33.7*  PLT 306 278 276      Coag's Last Labs   No results for input(s): APTT, INR in the last 168 hours.    Sepsis Markers Last Labs   No results for input(s): LATICACIDVEN, PROCALCITON, O2SATVEN in the last 168 hours.    ABG  Last Labs    Recent Labs Lab 02/07/16 0926  PHART 7.144*  PCO2ART 18.9*  PO2ART 119*  Liver Enzymes  Last Labs    Recent Labs Lab 01/31/16 1809  AST 32  ALT 58  ALKPHOS 146*  BILITOT 0.5  ALBUMIN 3.0*      Cardiac Enzymes Last Labs   No results for input(s): TROPONINI, PROBNP in the last 168 hours.    Glucose Last Labs   No results for input(s): GLUCAP in the last 168 hours.    Imaging pCXR - right IJ HD catheter in position, no infiltrates or effusions     STUDIES:  CT abd /pelvis 8/7 >>Postoperative changes of recent total colectomy and right lower quadrant ileostomy Renal US 8/7 >> nml kidneys Head CT 8/8 sub acute lt mCA infarct  CULTURES: 8/7 BCx2 >> MRSE 1/2 8/7 Urine Cx  ANTIBIOTICS: Zosyn 8/7 >>> Vancomycin 8/7 >>>  SIGNIFICANT EVENTS: 8/7 [facility] for renal failure and AMS  LINES/TUBES: RIJ HD cath 8/7  >>   DISCUSSION: 65 year old male with recent stroke with global aphasia and R sided weakness. Also with recent bowel ischemia s/p colectomy and ileostomy. Presented to [facility] from [facility] 7/31 for further rehab efforts. [facility] course complicated by renal failure and AMS.  40 mm difference between A line & cuff  ASSESSMENT / PLAN:  PULMONARY A: Improved ability to protect airway  P:    monitor on oxygen  CARDIOVASCULAR A:  Shock, hypovolemic vs sepsis  P:  Telemetry monitoring Levophed + vaso gtt for MAP goal > 65 -taper for SBP 110 Await echo    RENAL A:   Acute renal failure (Creatinine 3.26 > 6.79) Hypernatremia Hyperkalemia  P:   Consulted nephrology Ct CRRT  Treat electrolyte abnormalities as indicated.   GASTROINTESTINAL A:   Ileostomy status Nutrition  P:   NPO Increase TFs to 40/h Pepcid daily for SUP in setting renal failure  HEMATOLOGIC A:   Anemia   P:  Follow CBC Transfuse per ICU guidelines  INFECTIOUS A:   Septic shock -no source apparent  P:   Broad spectrum ABX as above Low PCT reassuring- can stop once clinical improvement Follow cultures  ENDOCRINE A:   DM  P:   CBG monitoring and SSI q 4 hours.   NEUROLOGIC A:   Acute metabolic encephalopathy in setting uremia and profound acidosis L sided CVA with global aphasia Was ambulatory post CVA  P:   RASS goal: 0 PRN fentanyl if needed    FAMILY  - Updates:   wife and son at bedside 8/8  - Inter-disciplinary family meet or Palliative Care meeting due by:  8/14    The patient is critically ill with multiple organ systems failure and requires high complexity decision making for assessment and support, frequent evaluation and titration of therapies, application of advanced monitoring technologies and extensive interpretation of multiple databases. Critical Care Time devoted to patient care services described in this note independent of APP time is 35  minutes.    Cyril Mourning MD. Tonny Bollman. [place] [facility] Pager [phone] If no response call [phone]   02/09/2016

## 2016-02-09 NOTE — Progress Notes (Signed)
*  PRELIMINARY RESULTS* Echocardiogram 2D Echocardiogram has been performed.  Jeryl Columbialliott, Brandley Aldrete 02/09/2016, 3:12 PM

## 2016-02-10 ENCOUNTER — Inpatient Hospital Stay
Admission: AD | Admit: 2016-02-10 | Discharge: 2016-02-29 | Disposition: A | Discharge status: Skilled Nursing Facility | Payer: Self-pay | Source: Ambulatory Visit | Attending: Internal Medicine | Admitting: Internal Medicine

## 2016-02-10 ENCOUNTER — Other Ambulatory Visit (HOSPITAL_COMMUNITY): Payer: Self-pay

## 2016-02-10 DIAGNOSIS — Z931 Gastrostomy status: Secondary | ICD-10-CM

## 2016-02-10 DIAGNOSIS — Z4659 Encounter for fitting and adjustment of other gastrointestinal appliance and device: Secondary | ICD-10-CM

## 2016-02-10 DIAGNOSIS — Z431 Encounter for attention to gastrostomy: Secondary | ICD-10-CM

## 2016-02-10 DIAGNOSIS — IMO0002 Reserved for concepts with insufficient information to code with codable children: Secondary | ICD-10-CM

## 2016-02-10 DIAGNOSIS — G9340 Encephalopathy, unspecified: Secondary | ICD-10-CM

## 2016-02-10 DIAGNOSIS — Y929 Unspecified place or not applicable: Secondary | ICD-10-CM

## 2016-02-10 DIAGNOSIS — Z992 Dependence on renal dialysis: Secondary | ICD-10-CM

## 2016-02-10 DIAGNOSIS — T8241XA Breakdown (mechanical) of vascular dialysis catheter, initial encounter: Secondary | ICD-10-CM

## 2016-02-10 DIAGNOSIS — Z789 Other specified health status: Secondary | ICD-10-CM

## 2016-02-10 DIAGNOSIS — D72829 Elevated white blood cell count, unspecified: Secondary | ICD-10-CM

## 2016-02-10 DIAGNOSIS — T829XXA Unspecified complication of cardiac and vascular prosthetic device, implant and graft, initial encounter: Secondary | ICD-10-CM

## 2016-02-10 LAB — BASIC METABOLIC PANEL
ANION GAP: 11 (ref 5–15)
BUN: 77 mg/dL — ABNORMAL HIGH (ref 6–20)
CALCIUM: 8.4 mg/dL — AB (ref 8.9–10.3)
CO2: 22 mmol/L (ref 22–32)
Chloride: 106 mmol/L (ref 101–111)
Creatinine, Ser: 3.95 mg/dL — ABNORMAL HIGH (ref 0.61–1.24)
GFR calc Af Amer: 17 mL/min — ABNORMAL LOW (ref >60)
GFR, EST NON AFRICAN AMERICAN: 14 mL/min — AB (ref >60)
GLUCOSE: 146 mg/dL — AB (ref 65–99)
Potassium: 4 mmol/L (ref 3.5–5.1)
Sodium: 139 mmol/L (ref 135–145)

## 2016-02-10 LAB — CBC
HCT: 23.4 % — ABNORMAL LOW (ref 39.0–52.0)
HEMOGLOBIN: 7.6 g/dL — AB (ref 13.0–17.0)
MCH: 31.8 pg (ref 26.0–34.0)
MCHC: 32.5 g/dL (ref 30.0–36.0)
MCV: 97.9 fL (ref 78.0–100.0)
PLATELETS: 163 10*3/uL (ref 150–400)
RBC: 2.39 MIL/uL — ABNORMAL LOW (ref 4.22–5.81)
RDW: 14 % (ref 11.5–15.5)
WBC: 9.7 10*3/uL (ref 4.0–10.5)

## 2016-02-10 LAB — CULTURE, BLOOD (ROUTINE X 2)

## 2016-02-10 LAB — GLUCOSE, CAPILLARY
GLUCOSE-CAPILLARY: 150 mg/dL — AB (ref 65–99)
Glucose-Capillary: 150 mg/dL — ABNORMAL HIGH (ref 65–99)
Glucose-Capillary: 151 mg/dL — ABNORMAL HIGH (ref 65–99)
Glucose-Capillary: 151 mg/dL — ABNORMAL HIGH (ref 65–99)

## 2016-02-10 MED ORDER — FAMOTIDINE IN NACL 20-0.9 MG/50ML-% IV SOLN: 20.0000 mg | INTRAVENOUS | Status: AC

## 2016-02-10 MED ORDER — INSULIN ASPART 100 UNIT/ML ~~LOC~~ SOLN: 0.0000 [IU] | SUBCUTANEOUS | 11 refills | Status: AC

## 2016-02-10 MED ORDER — CHLORHEXIDINE GLUCONATE 0.12 % MT SOLN: 15.0000 mL | Freq: Two times a day (BID) | OROMUCOSAL | 0 refills | Status: AC

## 2016-02-10 MED ORDER — HYDROCORTISONE NA SUCCINATE PF 100 MG IJ SOLR: 50.0000 mg | Freq: Four times a day (QID) | INTRAMUSCULAR | Status: AC

## 2016-02-10 MED ORDER — PIPERACILLIN-TAZOBACTAM IN DEX 2-0.25 GM/50ML IV SOLN: 2.2500 g | Freq: Three times a day (TID) | INTRAVENOUS | Status: AC

## 2016-02-10 MED ORDER — CETYLPYRIDINIUM CHLORIDE 0.05 % MT LIQD: 7.0000 mL | Freq: Two times a day (BID) | OROMUCOSAL | 0 refills | Status: AC

## 2016-02-10 MED ORDER — VITAL AF 1.2 CAL PO LIQD: 1000.0000 mL | ORAL | Status: AC

## 2016-02-10 MED ORDER — MUPIROCIN 2 % EX OINT: 1.0000 "application " | TOPICAL_OINTMENT | Freq: Two times a day (BID) | CUTANEOUS | 0 refills | Status: AC

## 2016-02-10 MED ORDER — DEXTROSE-NACL 5-0.45 % IV SOLN: 50.0000 mL/h | INTRAVENOUS | Status: AC

## 2016-02-10 MED ORDER — DEXTROSE-NACL 5-0.45 % IV SOLN
INTRAVENOUS | Status: DC
  Administered 2016-02-10: 10:00:00 via INTRAVENOUS

## 2016-02-10 MED ORDER — CHLORHEXIDINE GLUCONATE CLOTH 2 % EX PADS: 6.0000 | MEDICATED_PAD | Freq: Every day | CUTANEOUS | Status: AC

## 2016-02-10 NOTE — Discharge Summary (Signed)
Physician Discharge Summary  Patient ID: Brett Jenkins MRN: [id number] DOB/AGE: December 30, 1950 65 y.o.  Admit date: 02/07/2016 Discharge date: 02/10/2016    Discharge Diagnoses:  Hypovolemic Shock  Hx HTN Recent Bowel Ischemia s/p Colectomy & Ileostomy Oliguric Acute Renal Failure  Hyperkalemia  Hypernatremia  Metabolic Acidosis  Anemia  Diabetes Mellitus  Acute Metabolic Encephalopathy  Left CVA with Global Aphasia                                                                       DISCHARGE PLAN BY DIAGNOSIS      Hypovolemic Shock - no source identified for sepsis, suspect hypotension in the setting of diuresis, metformin & ACE-I Hx HTN  Discharge Plan: Hold home antihypertensive regimen  Continue stress dose steroids with taper to off over next 48-72 hours   Recent Bowel Ischemia s/p Colectomy & Ileostomy  Discharge Plan: Wound care per WOC > steri strips intact, wound c/d/i Colostomy care per protocol  Monitor for evidence of infection   Oliguric Acute Renal Failure - secondary to hypotension in the setting of diuresis, metformin & ACE-I Hyperkalemia - resolved Hypernatremia - resolved  Metabolic Acidosis - resolved   Discharge Plan: Trend daily BMP  Continue Nephrology evaluation at [facility] Replace electrolytes as indicated  D51/2 NS at 50 ml/hr May need perm cath placement   Anemia  Discharge Plan: Trend CBC  SCD's for DVT prophylaxis    Diabetes Mellitus   Discharge Plan: Hold home metformin  Continue SSI with Q4 CBG  Acute Metabolic Encephalopathy  Left CVA with Global Aphasia  Discharge Plan: Continue aggressive PT efforts  Mobilize, OOB to chair as tolerated  Monitor neuro status                 DISCHARGE SUMMARY   Brett Jenkins is a 65 y.o. y/o male with a PMH of HTN, DM, CVA and ischemic colitis who was recently admitted to [facility] for L sided CVA. He underwent embolectomy of L MCA. Angioplasty of the internal carotid  was unsuccessful. 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 After extubation he was transferred to [facility] for rehabilitation. Stay at [facility] has been complicated by worsening renal failure despite IVF hydration. Creatinine was initially 3.26 and has risen to 6.79. He also developed hyperkalemia and hyponatremia. WBC rose as well so he was started on broad spectrum antibiotics. 8/7 his mental status started to deteriorate and he developed profound acidemia with AKI and was transferred to [facility] for further evaluation.  CT of the abd/pelvis demonstrated post-operative changes consistent with recent total colectomy.  CT of the head was negative for acute changes but showed sub-acute left MCA infarct.  The patient was evaluated by Nephrology for AKI and felt to have oliguric acute renal failure in the setting of hypotension, concomitant ACE-I use, diuretics and metformin.  He initially required CVVHD and was ultimately transitioned to intermittent HD.  Acidosis resolved.  No infectious source identified during hospitalization, felt acute illness related to hypovolemia.  He was treated with IVF.  Mental status resolved.  Nutrition was consulted, tube feeding continued for nutritional support. He was treated with stress dose steroids for hypotension / relative adrenal insufficiency.  The patient was medically cleared for discharge 8/10 to [facility] with plans as above.   STUDIES:  CT abd /pelvis 8/7 >>Postoperative changes of recent total colectomy and right lower quadrant ileostomy Renal US 8/7 >> nml kidneys Head CT 8/8 sub acute lt mCA infarct ECHO 8/10 >> LVEF 55-60%, normal wall motion, grade 1 diastolic dysfunction  CULTURES: 8/7 BCx2 >> MRSE 1/2 8/7 Urine Cx  ANTIBIOTICS: Zosyn 8/7  >> Vancomycin 8/7 >>   LINES/TUBES: RIJ HD cath 8/7 >>  SIGNIFICANT EVENTS: 8/07 [facility] for renal failure and AMS 8/10  Tx back to [facility]    Discharge Exam: General: chronically ill appearing male in NAD Neuro: awakens to voice, repeats yes to most questions, generalized weakness  CV: s1s2 rrr, no m/r/g, no jvd PULM: even/non-labored, lungs bilaterally coarse  GI: obese/soft, bsx4 active, NGT in place  Extremities: warm/dry, no edema   Vitals:   02/10/16 0600 02/10/16 0700 02/10/16 0716 02/10/16 0800  BP: (!) 106/53 (!) 98/52  (!) 105/52  Pulse: 76 79  81  Resp: 18 19  16   Temp:   98.3 F (36.8 C)   TempSrc:   Oral   SpO2: 98% 98%  98%  Weight:      Height:         Discharge Labs  BMET  Recent Labs Lab 02/08/16 0409 02/08/16 1040 02/08/16 1621 02/09/16 0330 02/09/16 1600 02/10/16 0400  NA 143 140 139 138 137 139  K 4.0 3.6 3.9 3.9 3.9 4.0  CL 99* 94* 100* 104 105 106  CO2 26 29 26 24 23 22   GLUCOSE 191* 165* 165* 205* 138* 146*  BUN 107* 72* 63* 52* 62* 77*  CREATININE 5.35* 4.02* 3.47* 2.61* 3.01* 3.95*  CALCIUM 8.9 8.1* 7.6* 7.8* 8.2* 8.4*  MG 2.0  --   --  2.1  --   --   PHOS 6.5*  --  5.7* 3.6 3.1  --     CBC  Recent Labs Lab 02/07/16 1013 02/08/16 0409 02/10/16 0400  HGB 10.4* 10.9* 7.6*  HCT 33.7* 32.4* 23.4*  WBC 16.3* 15.1* 9.7  PLT 276 259 163    Discharge Instructions    Call MD for:  difficulty breathing, headache or visual disturbances    Complete by:  As directed   Call MD for:  hives    Complete by:  As directed   Call MD for:  persistant dizziness or light-headedness    Complete by:  As directed   Call MD for:  persistant nausea and vomiting    Complete by:  As directed   Call MD for:  redness, tenderness, or signs of infection (pain, swelling, redness, odor or green/yellow discharge around incision site)    Complete by:  As directed   Call MD for:  severe uncontrolled pain    Complete by:  As directed   Call  MD for:  temperature >100.4    Complete by:  As directed   Discharge instructions    Complete by:  As directed   Continue all current therapies for transfer to [facility]   Increase activity slowly    Complete by:  As directed            Medication List    STOP taking these medications   allopurinol 300 MG tablet Commonly  known as:  ZYLOPRIM   amLODipine 5 MG tablet Commonly known as:  NORVASC   aspirin EC 81 MG tablet   atenolol 50 MG tablet Commonly known as:  TENORMIN   atorvastatin 40 MG tablet Commonly known as:  LIPITOR   lisinopril-hydrochlorothiazide 20-12.5 MG tablet Commonly known as:  PRINZIDE,ZESTORETIC   metFORMIN 1000 MG tablet Commonly known as:  GLUCOPHAGE   Niacin CR 1000 MG Tbcr     TAKE these medications   acetaminophen 325 MG tablet Commonly known as:  TYLENOL Take 325-650 mg by mouth every 6 (six) hours as needed for mild pain, moderate pain or headache.   antiseptic oral rinse 0.05 % Liqd solution Commonly known as:  CPC / CETYLPYRIDINIUM CHLORIDE 0.05% 7 mLs by Mouth Rinse route 2 times daily at 12 noon and 4 pm.   chlorhexidine 0.12 % solution Commonly known as:  PERIDEX 15 mLs by Mouth Rinse route 2 (two) times daily.   Chlorhexidine Gluconate Cloth 2 % Pads Apply 6 each topically daily at 6 (six) AM.   dextrose 5 % and 0.45% NaCl infusion Inject 50 mL/hr into the vein continuous.   famotidine 20-0.9 MG/50ML-% Commonly known as:  PEPCID Inject 50 mLs (20 mg total) into the vein daily.   feeding supplement (VITAL AF 1.2 CAL) Liqd Place 1,000 mLs into feeding tube continuous.   hydrocortisone sodium succinate 100 MG Solr injection Commonly known as:  SOLU-CORTEF Inject 1 mL (50 mg total) into the vein every 6 (six) hours.   insulin aspart 100 UNIT/ML injection Commonly known as:  novoLOG Inject 0-9 Units into the skin every 4 (four) hours.   mupirocin ointment 2 % Commonly known as:  BACTROBAN Place 1  application into the nose 2 (two) times daily.   piperacillin-tazobactam 2-0.25 GM/50ML IVPB Commonly known as:  ZOSYN Inject 50 mLs (2.25 g total) into the vein every 8 (eight) hours.         Disposition: [place]   Discharged Condition: Brett Jenkins has met maximum benefit of inpatient care and is medically stable and cleared for discharge.  Patient is pending follow up as above.      Time spent on disposition:  Greater than 35 minutes.   Signed: Noe Gens, NP-C [place] [facility] Pgr: [phone] Office: [phone]

## 2016-02-10 NOTE — Progress Notes (Signed)
Assessment/Plan: 1. Oliguric ARF- in setting of hypotension and concomitant ACE-inhibitor, diuretic, and metformin. Add IVF 2. Hyperkalemia- due to #1, resolved 3. Met acidosis, resolved 4. Severe sepsis syndrome- unclear etiology, improving 5. HTN- off meds for now  6. Anemia                    Subjective: Interval History: more alert, off CRRT  Objective: Vital signs in last 24 hours: Temp:  [97.3 F (36.3 C)-99.9 F (37.7 C)] 98.3 F (36.8 C) (08/10 0716) Pulse Rate:  [28-89] 76 (08/10 0600) Resp:  [13-25] 18 (08/10 0600) BP: (74-116)/(34-94) 106/53 (08/10 0600) SpO2:  [96 %-100 %] 98 % (08/10 0600) Arterial Line BP: (102-138)/(27-43) 128/35 (08/10 0600) Weight:  [93.5 kg (206 lb 2.1 oz)] 93.5 kg (206 lb 2.1 oz) (08/10 0345) Weight change: -0.3 kg (-10.6 oz)  Intake/Output from previous day: 08/09 0701 - 08/10 0700 In: 1655.5 [I.V.:470.5; NG/GT:1015; IV Piggyback:150] Out: 1001 [Urine:250; Stool:675] Intake/Output this shift: No intake/output data recorded.  General appearance: alert  Skin turgor improved Attempts to speak, aphasia  Lab Results:  Recent Labs  02/08/16 0409 02/10/16 0400  WBC 15.1* 9.7  HGB 10.9* 7.6*  HCT 32.4* 23.4*  PLT 259 163   BMET:  Recent Labs  02/09/16 1600 02/10/16 0400  NA 137 139  K 3.9 4.0  CL 105 106  CO2 23 22  GLUCOSE 138* 146*  BUN 62* 77*  CREATININE 3.01* 3.95*  CALCIUM 8.2* 8.4*   No results for input(s): PTH in the last 72 hours. Iron Studies: No results for input(s): IRON, TIBC, TRANSFERRIN, FERRITIN in the last 72 hours. Studies/Results: Ct Head Wo Contrast  Result Date: 02/08/2016 CLINICAL DATA:  Acute encephalopathy.  Sepsis.  Stroke in July 2017. EXAM: CT HEAD WITHOUT CONTRAST TECHNIQUE: Contiguous axial images were obtained from the base of the skull through the vertex without intravenous contrast. COMPARISON:  None. FINDINGS: There is low density in the white matter of the left cerebral hemisphere in a  middle cerebral artery distribution. The overlying cortex is thin and high density. There is similar high density involving the adjacent caudate. No significant mass effect is seen. No intracranial hemorrhage. The ventricles and subarachnoid spaces are mildly enlarged. Mild patchy white matter low density in both cerebral hemispheres. Atheromatous cavernous carotid artery calcifications are noted. Unremarkable bones. A left maxillary sinus retention cyst is noted. There is also a large oval, circumscribed, low density mass in the subcutaneous fat at the posterior skull base/upper neck on the left. This mass measures 3.3 cm in maximum diameter and has a small track extending to the skin surface. IMPRESSION: 1. Changes compatible with a subacute infarct in a left middle cerebral artery distribution with overlying laminar necrosis. Correlation with any previous outside studies would be helpful. 2. Mild atrophy and mild chronic small vessel white matter ischemic changes in both cerebral hemispheres. 3. Large left posterior sebaceous cyst. Electronically Signed   By: Claudie Revering M.D.   On: 02/08/2016 13:40   Scheduled: . antiseptic oral rinse  7 mL Mouth Rinse q12n4p  . chlorhexidine  15 mL Mouth Rinse BID  . Chlorhexidine Gluconate Cloth  6 each Topical Q0600  . famotidine (PEPCID) IV  20 mg Intravenous Q24H  . hydrocortisone sod succinate (SOLU-CORTEF) inj  50 mg Intravenous Q6H  . insulin aspart  0-9 Units Subcutaneous Q4H  . mupirocin ointment  1 application Nasal BID  . piperacillin-tazobactam (ZOSYN)  IV  2.25 g Intravenous Q8H  LOS: 3 days   Khaleelah Yowell C 02/10/2016,8:16 AM

## 2016-02-10 NOTE — Care Management Note (Signed)
Case Management Note  Patient Details  Name: Brett Jenkins MRN: [id number] Date of Birth: 02/28/1951  Subjective/Objective:       [facility] by worsening renal failure despite IVF hydration. Creatinine was initially 3.26 and has risen to 6.79. He also developed hyperkalemia and hyponatremia. WBC rose as well so he was started on broad spectrum antibiotics. 8/7 his mental status started to deteriorate and he developed profound acidemia with AKI and [facility]               Action/Plan:   Expected Discharge Date:                  Expected Discharge Plan:  Long Term Acute Care (LTAC)  In-House Referral:     Discharge planning Services  CM Consult  Post Acute Care Choice:    Choice offered to:     DME Arranged:    DME Agency:     HH Arranged:    HH Agency:     Status of Service:  Completed, signed off  If discussed at [employer] Meetings, dates discussed:    Additional Comments: 02/10/2016 Pt will discharge to [facility] today.  02/09/16 CM spoke in detail with wife and son - they expect pt to discharge back to [facility].  [facility] liason confirmed that pt will discharge back to facility - per attending possibly 02/10/16 Brett Jenkins, Brett Meland S, RN 02/10/2016, 11:48 AM

## 2016-02-11 LAB — COMPREHENSIVE METABOLIC PANEL
ALBUMIN: 2.2 g/dL — AB (ref 3.5–5.0)
ALT: 120 U/L — ABNORMAL HIGH (ref 17–63)
ANION GAP: 15 (ref 5–15)
AST: 51 U/L — AB (ref 15–41)
Alkaline Phosphatase: 202 U/L — ABNORMAL HIGH (ref 38–126)
BUN: 103 mg/dL — AB (ref 6–20)
CHLORIDE: 104 mmol/L (ref 101–111)
CO2: 20 mmol/L — AB (ref 22–32)
Calcium: 8.7 mg/dL — ABNORMAL LOW (ref 8.9–10.3)
Creatinine, Ser: 4.9 mg/dL — ABNORMAL HIGH (ref 0.61–1.24)
GFR calc Af Amer: 13 mL/min — ABNORMAL LOW (ref >60)
GFR calc non Af Amer: 11 mL/min — ABNORMAL LOW (ref >60)
GLUCOSE: 150 mg/dL — AB (ref 65–99)
POTASSIUM: 4.1 mmol/L (ref 3.5–5.1)
SODIUM: 139 mmol/L (ref 135–145)
Total Bilirubin: 0.5 mg/dL (ref 0.3–1.2)
Total Protein: 5.3 g/dL — ABNORMAL LOW (ref 6.5–8.1)

## 2016-02-11 LAB — CBC WITH DIFFERENTIAL/PLATELET
Basophils Absolute: 0 10*3/uL (ref 0.0–0.1)
Basophils Relative: 0 %
EOS PCT: 0 %
Eosinophils Absolute: 0 10*3/uL (ref 0.0–0.7)
HEMATOCRIT: 24.2 % — AB (ref 39.0–52.0)
Hemoglobin: 8.1 g/dL — ABNORMAL LOW (ref 13.0–17.0)
LYMPHS ABS: 1.7 10*3/uL (ref 0.7–4.0)
LYMPHS PCT: 15 %
MCH: 33.1 pg (ref 26.0–34.0)
MCHC: 33.5 g/dL (ref 30.0–36.0)
MCV: 98.8 fL (ref 78.0–100.0)
MONO ABS: 1.3 10*3/uL — AB (ref 0.1–1.0)
Monocytes Relative: 11 %
NEUTROS ABS: 8.6 10*3/uL — AB (ref 1.7–7.7)
Neutrophils Relative %: 74 %
PLATELETS: 170 10*3/uL (ref 150–400)
RBC: 2.45 MIL/uL — AB (ref 4.22–5.81)
RDW: 13.8 % (ref 11.5–15.5)
WBC: 11.6 10*3/uL — ABNORMAL HIGH (ref 4.0–10.5)

## 2016-02-11 LAB — MAGNESIUM: MAGNESIUM: 2.5 mg/dL — AB (ref 1.7–2.4)

## 2016-02-11 LAB — PHOSPHORUS: PHOSPHORUS: 3.7 mg/dL (ref 2.5–4.6)

## 2016-02-11 LAB — T4, FREE: FREE T4: 0.99 ng/dL (ref 0.61–1.12)

## 2016-02-11 LAB — PROTIME-INR
INR: 1.09
Prothrombin Time: 14.1 seconds (ref 11.4–15.2)

## 2016-02-11 LAB — TSH: TSH: 0.427 u[IU]/mL (ref 0.350–4.500)

## 2016-02-11 LAB — PROCALCITONIN: PROCALCITONIN: 0.8 ng/mL

## 2016-02-11 NOTE — Progress Notes (Signed)
[facility]  ROUNDING NOTE   Subjective:  Patient [facility] after our initial consult. He had several days of continuous renal replacement therapy. Thereafter he was taken off. He was transitioned back here for care. Urine output over the night was only 250 cc. BUN and creatinine are rising.   Objective:  Vital signs in last 24 hours:  Temp:  [98.3 F (36.8 C)] 98.3 F (36.8 C) (08/10 1150) Pulse Rate:  [79-85] 85 (08/10 1400) Resp:  [16-35] 20 (08/10 1400) BP: (96-120)/(51-59) 108/59 (08/10 1400) SpO2:  [98 %-99 %] 99 % (08/10 1400) Arterial Line BP: (129-151)/(34-53) 151/42 (08/10 1400)   Physical Exam: General: Critically ill-appearing.  Head: Normocephalic, atraumatic. NG tube in place  Eyes: Anicteric  Neck: Supple, trachea midline  Lungs:  Scattered rhonchi, normal effort  Heart: S1S2 no rubs  Abdomen:  Midline abdominal incision noted. Colostomy noted.  Extremities: 1+ peripheral edema.  Neurologic: Overweight, will not yes/no to questions.  Skin: No lesions  Access: Temporary right internal jugular Til[place] his catheter    Basic Metabolic Panel:  Recent Labs Lab 02/08/16 0409  02/08/16 1621 02/09/16 0330 02/09/16 1600 02/10/16 0400 02/11/16 0538  NA 143  < > 139 138 137 139 139  K 4.0  < > 3.9 3.9 3.9 4.0 4.1  CL 99*  < > 100* 104 105 106 104  CO2 26  < > 26 24 23 22  20*  GLUCOSE 191*  < > 165* 205* 138* 146* 150*  BUN 107*  < > 63* 52* 62* 77* 103*  CREATININE 5.35*  < > 3.47* 2.61* 3.01* 3.95* 4.90*  CALCIUM 8.9  < > 7.6* 7.8* 8.2* 8.4* 8.7*  MG 2.0  --   --  2.1  --   --  2.5*  PHOS 6.5*  --  5.7* 3.6 3.1  --  3.7  < > = values in this interval not displayed.  Liver Function Tests:  Recent Labs Lab 02/08/16 1621 02/09/16 0330 02/09/16 1600 02/11/16 0538  AST  --   --   --  51*  ALT  --   --   --  120*  ALKPHOS  --   --   --  202*  BILITOT  --   --   --  0.5  PROT  --   --   --  5.3*  ALBUMIN  2.3* 2.1* 2.0* 2.2*   No results for input(s): LIPASE, AMYLASE in the last 168 hours.  Recent Labs Lab 02/08/16 1040  AMMONIA 17    CBC:  Recent Labs Lab 02/07/16 0536 02/07/16 1013 02/08/16 0409 02/10/16 0400 02/11/16 0538  WBC 17.7* 16.3* 15.1* 9.7 11.6*  NEUTROABS  --   --   --   --  8.6*  HGB 13.2 10.4* 10.9* 7.6* 8.1*  HCT 41.4 33.7* 32.4* 23.4* 24.2*  MCV 103.5* 104.0* 98.2 97.9 98.8  PLT 278 276 259 163 170    Cardiac Enzymes:  Recent Labs Lab 02/08/16 1040  TROPONINI 0.06*    BNP: Invalid input(s): POCBNP  CBG:  Recent Labs Lab 02/09/16 2024 02/10/16 0020 02/10/16 0405 02/10/16 0712 02/10/16 1147  GLUCAP 170* 151* 151* 150* 150*    Microbiology: Results for orders placed or performed during the hospital encounter of 02/07/16  MRSA PCR Screening     Status: Abnormal   Collection Time: 02/07/16  5:10 PM  Result Value Ref Range Status   MRSA by PCR POSITIVE (A) NEGATIVE Final  Comment: RCRV B. SHEPHERD,RN AT 2124 ON [id number] BY Lucienne Capers        The GeneXpert MRSA Assay (FDA approved for NASAL specimens only), is one component of a comprehensive MRSA colonization surveillance program. It is not intended to diagnose MRSA infection nor to guide or monitor treatment for MRSA infections.     Coagulation Studies:  Recent Labs  02/11/16 0538  LABPROT 14.1  INR 1.09    Urinalysis: No results for input(s): COLORURINE, LABSPEC, PHURINE, GLUCOSEU, HGBUR, BILIRUBINUR, KETONESUR, PROTEINUR, UROBILINOGEN, NITRITE, LEUKOCYTESUR in the last 72 hours.  Invalid input(s): APPERANCEUR    Imaging: Dg Chest Port 1 View  Result Date: 02/10/2016 CLINICAL DATA:  Baseline forced expiratory volume at end of one second (FEV1) 30% to 80% predicted [id number] EXAM: PORTABLE CHEST 1 VIEW COMPARISON:  02/07/2016 FINDINGS: Cardiac silhouette is mildly enlarged. No mediastinal or hilar masses or convincing adenopathy. The lungs are clear. No pleural  effusion. No pneumothorax. Nasal/orogastric tube passes below the included field of view into the stomach. Right internal jugular central venous line is stable with its tip in the mid to upper superior vena cava. IMPRESSION: No acute cardiopulmonary disease. No significant change from the recent prior study. Electronically Signed   By: Amie Portland M.D.   On: 02/10/2016 17:03   Dg Abd Portable 1v  Result Date: 02/10/2016 CLINICAL DATA:  NG tube placement. EXAM: PORTABLE ABDOMEN - 1 VIEW COMPARISON:  CT, 02/07/2016 FINDINGS: Nasogastric tube tip extends from the left upper quadrant curling across the right mid abdomen to have its tip projecting over the lower lumbar spine, likely in the duodenum. There are mildly dilated partly air-filled loops of prominent small bowel in the left mid to lower quadrant. IMPRESSION: Nasogastric tube tip projects in the expected location of the duodenum. Electronically Signed   By: Amie Portland M.D.   On: 02/10/2016 17:02     Medications:       Assessment/ Plan:  65 y.o. male  with a PMHx of diabetes mellitus type 2, hypertension, dysphagia, history of ischemic left MCA stroke, obesity, who was admitted to [facility] on 01/31/2016 for ongoing medical management status post recent ischemic left MCA stroke, dysphasia.  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  On 01/24/2016 unfortunately he suffered a left MCA distribution CVA with hemorrhage in the left basal ganglia.  We are asked now to see him for acute renal failure.  1.  Acute renal failure, multifactorial with contributions from hypotension, diuretics, ACEi 2.  Anemia unspecified. 3.  Metabolic acidosis, previously on metformin 4.  Hypotension previously requiring pressors.  Plan:  The patient was transitioned to [facility] after our initial consultation continuous renal replacement therapy. He was taken off. However BUN and  creatinine are still rising in urine output was only 350 cc over the preceding 24 hours. Therefore we will plan for hemodialysis today. We will plan for a three-hour treatment with ultrafiltration target of 1 kg. Electrolytes appear to be acceptable at the moment. Continue to monitor his progress over the weekend.  Dr. Thedore Mins will reassess for dialysis on Monday.    LOS: 0 Tramell Piechota 8/11/20178:33 AM

## 2016-02-12 ENCOUNTER — Other Ambulatory Visit (HOSPITAL_COMMUNITY): Payer: Self-pay

## 2016-02-12 LAB — CULTURE, BLOOD (ROUTINE X 2): Culture: NO GROWTH

## 2016-02-12 LAB — BASIC METABOLIC PANEL
ANION GAP: 12 (ref 5–15)
BUN: 115 mg/dL — AB (ref 6–20)
CO2: 20 mmol/L — AB (ref 22–32)
Calcium: 8.7 mg/dL — ABNORMAL LOW (ref 8.9–10.3)
Chloride: 107 mmol/L (ref 101–111)
Creatinine, Ser: 5.25 mg/dL — ABNORMAL HIGH (ref 0.61–1.24)
GFR calc Af Amer: 12 mL/min — ABNORMAL LOW (ref >60)
GFR calc non Af Amer: 10 mL/min — ABNORMAL LOW (ref >60)
GLUCOSE: 124 mg/dL — AB (ref 65–99)
POTASSIUM: 3.9 mmol/L (ref 3.5–5.1)
Sodium: 139 mmol/L (ref 135–145)

## 2016-02-12 LAB — HEPATITIS B SURFACE ANTIBODY,QUALITATIVE: HEP B S AB: NONREACTIVE

## 2016-02-12 LAB — HEPATITIS B CORE ANTIBODY, TOTAL: Hep B Core Total Ab: NEGATIVE

## 2016-02-12 LAB — HEPATITIS B SURFACE ANTIGEN: HEP B S AG: NEGATIVE

## 2016-02-14 LAB — RENAL FUNCTION PANEL
ALBUMIN: 2.4 g/dL — AB (ref 3.5–5.0)
Anion gap: 14 (ref 5–15)
BUN: 86 mg/dL — ABNORMAL HIGH (ref 6–20)
CALCIUM: 8.8 mg/dL — AB (ref 8.9–10.3)
CHLORIDE: 103 mmol/L (ref 101–111)
CO2: 19 mmol/L — ABNORMAL LOW (ref 22–32)
CREATININE: 3.88 mg/dL — AB (ref 0.61–1.24)
GFR, EST AFRICAN AMERICAN: 17 mL/min — AB (ref >60)
GFR, EST NON AFRICAN AMERICAN: 15 mL/min — AB (ref >60)
Glucose, Bld: 187 mg/dL — ABNORMAL HIGH (ref 65–99)
PHOSPHORUS: 4.1 mg/dL (ref 2.5–4.6)
Potassium: 3.8 mmol/L (ref 3.5–5.1)
SODIUM: 136 mmol/L (ref 135–145)

## 2016-02-14 LAB — CBC
HCT: 25.4 % — ABNORMAL LOW (ref 39.0–52.0)
Hemoglobin: 8.5 g/dL — ABNORMAL LOW (ref 13.0–17.0)
MCH: 32.9 pg (ref 26.0–34.0)
MCHC: 33.5 g/dL (ref 30.0–36.0)
MCV: 98.4 fL (ref 78.0–100.0)
PLATELETS: 168 10*3/uL (ref 150–400)
RBC: 2.58 MIL/uL — AB (ref 4.22–5.81)
RDW: 14.3 % (ref 11.5–15.5)
WBC: 16.8 10*3/uL — AB (ref 4.0–10.5)

## 2016-02-14 NOTE — Progress Notes (Signed)
[facility]  ROUNDING NOTE   Subjective:  Patient remains critically ill UOP ~ 700 cc BUN/Cr still elevated Able to answer simple questions   Objective:  Vital signs in last 24 hours:    97.7   89  18  164/83  Physical Exam: General: Critically ill-appearing.  Head: Normocephalic, atraumatic. NG tube in place  Eyes: Anicteric  Neck: Supple, trachea midline  Lungs:  Scattered rhonchi, normal effort  Heart: S1S2 no rubs  Abdomen:  Midline abdominal incision noted. Colostomy noted.  Extremities: 1+ peripheral edema.  Neurologic: will answer simple yes/no  questions.  Skin: No lesions  Access: Temporary right internal jugular     Basic Metabolic Panel:  Recent Labs Lab 02/08/16 0409  02/08/16 1621 02/09/16 0330 02/09/16 1600 02/10/16 0400 02/11/16 0538 02/12/16 0710 02/14/16 1143  NA 143  < > 139 138 137 139 139 139 136  K 4.0  < > 3.9 3.9 3.9 4.0 4.1 3.9 3.8  CL 99*  < > 100* 104 105 106 104 107 103  CO2 26  < > 26 24 23 22  20* 20* 19*  GLUCOSE 191*  < > 165* 205* 138* 146* 150* 124* 187*  BUN 107*  < > 63* 52* 62* 77* 103* 115* 86*  CREATININE 5.35*  < > 3.47* 2.61* 3.01* 3.95* 4.90* 5.25* 3.88*  CALCIUM 8.9  < > 7.6* 7.8* 8.2* 8.4* 8.7* 8.7* 8.8*  MG 2.0  --   --  2.1  --   --  2.5*  --   --   PHOS 6.5*  --  5.7* 3.6 3.1  --  3.7  --  4.1  < > = values in this interval not displayed.  Liver Function Tests:  Recent Labs Lab 02/08/16 1621 02/09/16 0330 02/09/16 1600 02/11/16 0538 02/14/16 1143  AST  --   --   --  51*  --   ALT  --   --   --  120*  --   ALKPHOS  --   --   --  202*  --   BILITOT  --   --   --  0.5  --   PROT  --   --   --  5.3*  --   ALBUMIN 2.3* 2.1* 2.0* 2.2* 2.4*   No results for input(s): LIPASE, AMYLASE in the last 168 hours.  Recent Labs Lab 02/08/16 1040  AMMONIA 17    CBC:  Recent Labs Lab 02/08/16 0409 02/10/16 0400 02/11/16 0538 02/14/16 1143  WBC 15.1* 9.7 11.6* 16.8*  NEUTROABS  --   --  8.6*   --   HGB 10.9* 7.6* 8.1* 8.5*  HCT 32.4* 23.4* 24.2* 25.4*  MCV 98.2 97.9 98.8 98.4  PLT 259 163 170 168    Cardiac Enzymes:  Recent Labs Lab 02/08/16 1040  TROPONINI 0.06*    BNP: Invalid input(s): POCBNP  CBG:  Recent Labs Lab 02/09/16 2024 02/10/16 0020 02/10/16 0405 02/10/16 0712 02/10/16 1147  GLUCAP 170* 151* 151* 150* 150*    Microbiology: Results for orders placed or performed during the hospital encounter of 02/07/16  MRSA PCR Screening     Status: Abnormal   Collection Time: 02/07/16  5:10 PM  Result Value Ref Range Status   MRSA by PCR POSITIVE (A) NEGATIVE Final    Comment: RCRV B. SHEPHERD,RN AT 2124 ON [id number] BY Lucienne CapersS. YARBROUGH        The GeneXpert MRSA Assay (FDA approved for NASAL specimens only),  is one component of a comprehensive MRSA colonization surveillance program. It is not intended to diagnose MRSA infection nor to guide or monitor treatment for MRSA infections.     Coagulation Studies: No results for input(s): LABPROT, INR in the last 72 hours.  Urinalysis: No results for input(s): COLORURINE, LABSPEC, PHURINE, GLUCOSEU, HGBUR, BILIRUBINUR, KETONESUR, PROTEINUR, UROBILINOGEN, NITRITE, LEUKOCYTESUR in the last 72 hours.  Invalid input(s): APPERANCEUR    Imaging: Dg Chest Port 1 View  Result Date: 02/12/2016 CLINICAL DATA:  Dialysis catheter placement EXAM: PORTABLE CHEST 1 VIEW COMPARISON:  Portable exam 1826 hours compared 02/10/2016 FINDINGS: RIGHT jugular dual-lumen central venous catheter with tip projecting over SVC. Nasogastric tube extends into abdomen. Stable upper normal heart size. Normal mediastinal contours and pulmonary vascularity. Lungs clear. No pleural effusion or pneumothorax. IMPRESSION: No pneumothorax following RIGHT jugular line placement. Electronically Signed   By: Ulyses SouthwardMark  Boles M.D.   On: 02/12/2016 18:36     Medications:       Assessment/ Plan:  65 y.o. male  with a PMHx of diabetes mellitus type  2, hypertension, dysphagia, history of ischemic left MCA stroke, obesity, who was admitted to [facility] on 01/31/2016 for ongoing medical management status post recent ischemic left MCA stroke, dysphasia.  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  On 01/24/2016 unfortunately he suffered a left MCA distribution CVA with hemorrhage in the left basal ganglia.  We are asked now to see him for acute renal failure.  1.  Acute renal failure, multifactorial with contributions from hypotension, diuretics, ACEi 2.  Anemia unspecified. 3.  Metabolic acidosis, previously on metformin 4.  Hypotension previously requiring pressors.  Plan:  Remains critically ill UOP ~ 700 cc BUN/Cr elevated Will dialyze today. No UF.  Labs daily BUN likely elevated due to high dose steroids    LOS: 0 Giamarie Bueche 8/14/20173:34 PM

## 2016-02-15 ENCOUNTER — Other Ambulatory Visit (HOSPITAL_COMMUNITY): Payer: Self-pay

## 2016-02-15 LAB — BASIC METABOLIC PANEL
Anion gap: 9 (ref 5–15)
BUN: 40 mg/dL — AB (ref 6–20)
CALCIUM: 7.5 mg/dL — AB (ref 8.9–10.3)
CO2: 21 mmol/L — ABNORMAL LOW (ref 22–32)
Chloride: 109 mmol/L (ref 101–111)
Creatinine, Ser: 2.42 mg/dL — ABNORMAL HIGH (ref 0.61–1.24)
GFR calc Af Amer: 30 mL/min — ABNORMAL LOW (ref >60)
GFR, EST NON AFRICAN AMERICAN: 26 mL/min — AB (ref >60)
Glucose, Bld: 85 mg/dL (ref 65–99)
POTASSIUM: 3.3 mmol/L — AB (ref 3.5–5.1)
SODIUM: 139 mmol/L (ref 135–145)

## 2016-02-15 LAB — CBC
HEMATOCRIT: 22.6 % — AB (ref 39.0–52.0)
Hemoglobin: 7.4 g/dL — ABNORMAL LOW (ref 13.0–17.0)
MCH: 32.3 pg (ref 26.0–34.0)
MCHC: 32.7 g/dL (ref 30.0–36.0)
MCV: 98.7 fL (ref 78.0–100.0)
PLATELETS: 145 10*3/uL — AB (ref 150–400)
RBC: 2.29 MIL/uL — ABNORMAL LOW (ref 4.22–5.81)
RDW: 14.3 % (ref 11.5–15.5)
WBC: 10.9 10*3/uL — AB (ref 4.0–10.5)

## 2016-02-16 LAB — RENAL FUNCTION PANEL
ALBUMIN: 2.2 g/dL — AB (ref 3.5–5.0)
Anion gap: 11 (ref 5–15)
BUN: 58 mg/dL — AB (ref 6–20)
CALCIUM: 8.3 mg/dL — AB (ref 8.9–10.3)
CHLORIDE: 106 mmol/L (ref 101–111)
CO2: 21 mmol/L — ABNORMAL LOW (ref 22–32)
CREATININE: 3.18 mg/dL — AB (ref 0.61–1.24)
GFR, EST AFRICAN AMERICAN: 22 mL/min — AB (ref >60)
GFR, EST NON AFRICAN AMERICAN: 19 mL/min — AB (ref >60)
Glucose, Bld: 126 mg/dL — ABNORMAL HIGH (ref 65–99)
PHOSPHORUS: 3.5 mg/dL (ref 2.5–4.6)
Potassium: 3.6 mmol/L (ref 3.5–5.1)
Sodium: 138 mmol/L (ref 135–145)

## 2016-02-16 NOTE — Progress Notes (Signed)
[facility]  ROUNDING NOTE   Subjective:  Patient remains critically ill UOP ~ 750 cc. Ostomy output 400 cc BUN/Cr elevated compared to yesterday Able to answer simple questions Wife and son are in the room with him today Patient pulled out his temp dialysis catheter Monday night Currently getting NS @ 75 /hr in addition to tube feeds  Objective:  Vital signs in last 24 hours:    Temp 97.2  Pulse 89  Resp 19  BP 146/69  Physical Exam: General: Critically ill-appearing.  Head: Normocephalic, atraumatic. NG tube in place  Eyes: Anicteric  Neck: Supple, trachea midline  Lungs:  Scattered rhonchi, normal effort  Heart: S1S2 no rubs  Abdomen:  Midline abdominal incision noted. Colostomy noted.  Extremities: 1+ peripheral edema. Especially in arms  Neurologic: will answer simple yes/no  questions.  Skin: No acute lesions  Access: No dialysis cathter at present    Basic Metabolic Panel:  Recent Labs Lab 02/09/16 1600  02/11/16 0538 02/12/16 0710 02/14/16 1143 02/15/16 0845 02/16/16 1021  NA 137  < > 139 139 136 139 138  K 3.9  < > 4.1 3.9 3.8 3.3* 3.6  CL 105  < > 104 107 103 109 106  CO2 23  < > 20* 20* 19* 21* 21*  GLUCOSE 138*  < > 150* 124* 187* 85 126*  BUN 62*  < > 103* 115* 86* 40* 58*  CREATININE 3.01*  < > 4.90* 5.25* 3.88* 2.42* 3.18*  CALCIUM 8.2*  < > 8.7* 8.7* 8.8* 7.5* 8.3*  MG  --   --  2.5*  --   --   --   --   PHOS 3.1  --  3.7  --  4.1  --  3.5  < > = values in this interval not displayed.  Liver Function Tests:  Recent Labs Lab 02/09/16 1600 02/11/16 0538 02/14/16 1143 02/16/16 1021  AST  --  51*  --   --   ALT  --  120*  --   --   ALKPHOS  --  202*  --   --   BILITOT  --  0.5  --   --   PROT  --  5.3*  --   --   ALBUMIN 2.0* 2.2* 2.4* 2.2*   No results for input(s): LIPASE, AMYLASE in the last 168 hours. No results for input(s): AMMONIA in the last 168 hours.  CBC:  Recent Labs Lab 02/10/16 0400 02/11/16 0538  02/14/16 1143 02/15/16 0845  WBC 9.7 11.6* 16.8* 10.9*  NEUTROABS  --  8.6*  --   --   HGB 7.6* 8.1* 8.5* 7.4*  HCT 23.4* 24.2* 25.4* 22.6*  MCV 97.9 98.8 98.4 98.7  PLT 163 170 168 145*    Cardiac Enzymes: No results for input(s): CKTOTAL, CKMB, CKMBINDEX, TROPONINI in the last 168 hours.  BNP: Invalid input(s): POCBNP  CBG:  Recent Labs Lab 02/09/16 2024 02/10/16 0020 02/10/16 0405 02/10/16 0712 02/10/16 1147  GLUCAP 170* 151* 151* 150* 150*    Microbiology: Results for orders placed or performed during the hospital encounter of 02/10/16  Culture, blood (routine x 2)     Status: None (Preliminary result)   Collection Time: 02/14/16  7:23 PM  Result Value Ref Range Status   Specimen Description BLOOD HEMODIALYSIS CATHETER ARTERIAL  Final   Special Requests BOTTLES DRAWN AEROBIC AND ANAEROBIC  10CC EA  Final   Culture NO GROWTH 2 DAYS  Final  Report Status PENDING  Incomplete  Culture, blood (routine x 2)     Status: None (Preliminary result)   Collection Time: 02/14/16  7:32 PM  Result Value Ref Range Status   Specimen Description BLOOD BLOOD RIGHT ARM  Final   Special Requests BOTTLES DRAWN AEROBIC AND ANAEROBIC 5CC EA  Final   Culture NO GROWTH 2 DAYS  Final   Report Status PENDING  Incomplete    Coagulation Studies: No results for input(s): LABPROT, INR in the last 72 hours.  Urinalysis: No results for input(s): COLORURINE, LABSPEC, PHURINE, GLUCOSEU, HGBUR, BILIRUBINUR, KETONESUR, PROTEINUR, UROBILINOGEN, NITRITE, LEUKOCYTESUR in the last 72 hours.  Invalid input(s): APPERANCEUR    Imaging: Dg Chest Port 1 View  Result Date: 02/15/2016 CLINICAL DATA:  Elevated white blood cell count, acute respiratory failure, shock, acute renal failure, CVA, ischemic colitis. EXAM: PORTABLE CHEST 1 VIEW COMPARISON:  Portable chest x-ray of February 12, 2016 FINDINGS: The lungs are well-expanded. The interstitial markings are increased bilaterally. There is no pleural  effusion. The cardiac silhouette is enlarged. The pulmonary vascularity is engorged. There is calcification in the wall of the aortic arch. There is an esophagogastric tube present whose tip projects below the inferior margin of the image. IMPRESSION: CHF with mild pulmonary interstitial edema worse since the earlier study. No alveolar pneumonia. Aortic atherosclerosis. Electronically Signed   By: David  SwazilandJordan M.D.   On: 02/15/2016 07:45   Dg Abd Portable 1v  Addendum Date: 02/15/2016   ADDENDUM REPORT: 02/15/2016 05:30 ADDENDUM: Correction to previous report: Due to PACS error, a chest x-ray from another patient was included with the abdominal image for this patient. The following corrected dictation replaces the findings and impression section from the previous report: FINDINGS: Enteric tube tip is localized to the left upper quadrant consistent with location in the upper stomach. Visualized upper bowel loops are nondistended. Degenerative changes in the spine. IMPRESSION: Enteric tube tip in the left upper quadrant consistent with location in the upper stomach. Electronically Signed   By: Burman NievesWilliam  Stevens M.D.   On: 02/15/2016 05:30   Result Date: 02/15/2016 CLINICAL DATA:  NG tube placement EXAM: PORTABLE ABDOMEN - 1 VIEW COMPARISON:  02/10/2016 FINDINGS: Enteric tube tip is present in the left upper quadrant consistent with location in the upper stomach. Chest radiograph demonstrates endotracheal tube with tip measuring 4.2 cm above the carina. Right central venous catheter with tip over the low SVC region. Shallow inspiration. Diffuse interstitial pattern to the lungs may represent diffuse interstitial fibrosis or edema. Cardiac enlargement. Atelectasis in the lung bases. No pneumothorax IMPRESSION: Appliances appear in satisfactory position. Cardiac enlargement. Diffuse interstitial changes in the lungs likely representing fibrosis but could indicate edema. Atelectasis in the lung bases. Enteric tube  tip in the left upper quadrant consistent with location in the upper stomach. Electronically Signed: By: Burman NievesWilliam  Stevens M.D. On: 02/15/2016 05:16     Medications:       Assessment/ Plan:  65 y.o. male  with a PMHx of diabetes mellitus type 2, hypertension, dysphagia, history of ischemic left MCA stroke, obesity, who was admitted to [facility] on 01/31/2016 for ongoing medical management status post recent ischemic left MCA stroke, dysphasia.  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  On 01/24/2016 unfortunately he suffered a left MCA distribution CVA with hemorrhage in the left basal ganglia.  We are asked now to see him for acute renal failure.  1.  Acute renal  failure, multifactorial with contributions from hypotension, diuretics, ACEi - ? Baseline 1.20/GFR 62 from 01/31/16 (care everywhere) 2.  Anemia unspecified. 3.  Metabolic acidosis, previously on metformin 4.  Hypotension previously requiring pressors. 5.  Others- Left MCA stroke, left basal ganglia hemorrhage, ischemic colitis s/p subtotal colectomy with colostomy 01/20/16 Plan:  Remains critically ill UOP ~ 750 cc Hold HD today.  Monitor Labs daily BUN likely elevated due to high dose steroids Temporary dialysis catheter to be placed by [facility] Appreciate assistance    LOS: 0 Brett Jenkins 8/16/20173:29 PM

## 2016-02-17 ENCOUNTER — Encounter (HOSPITAL_COMMUNITY): Payer: Self-pay | Admitting: Interventional Radiology

## 2016-02-17 ENCOUNTER — Other Ambulatory Visit (HOSPITAL_COMMUNITY): Payer: Self-pay

## 2016-02-17 HISTORY — PX: IR GENERIC HISTORICAL: IMG1180011

## 2016-02-17 MED ORDER — HEPARIN SODIUM (PORCINE) 1000 UNIT/ML IJ SOLN
INTRAMUSCULAR | Status: AC
  Administered 2016-02-17: 2800 [IU]
  Filled 2016-02-17: qty 1

## 2016-02-17 MED ORDER — LIDOCAINE-EPINEPHRINE (PF) 1 %-1:200000 IJ SOLN
INTRAMUSCULAR | Status: AC | PRN
  Administered 2016-02-17: 10 mL via INTRADERMAL

## 2016-02-17 MED ORDER — LIDOCAINE HCL 1 % IJ SOLN
INTRAMUSCULAR | Status: AC
  Filled 2016-02-17: qty 20

## 2016-02-17 NOTE — Procedures (Addendum)
Interventional Radiology Procedure Note  Procedure:  Right IJ temporary HD cath, 20cm length.   Findings: Partial occlusion of the right IJ.  Complications: None  Recommendations:  - Ok to use catheter - Do not submerge  - Routine line care   Signed,  Yvone NeuJaime S. Loreta AveWagner, DO

## 2016-02-17 NOTE — H&P (Signed)
Chief Complaint Patient was seen in consultation today for gastrostomy tube placment at the request of Dr. Carron Curie  Referring Physician(s): Dr. Carron Curie  Supervising Physician: Gilmer Mor  Patient Status: Inpatient  History of Present Illness: Brett Jenkins is a 65 y.o. male [facility] pt with dysphagia in need of G-tube placement. He was at outside facility for for L sided CVA. He underwent embolectomy of L MCA. Angioplasty of the internal carotid was unsuccessful. He was left with residual R hemiparesis and global aphasia as well as dysphagia. 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 He was transferred to [facility] for continued care and rehab. He currently gets TF via NGT which he is tolerating well. IR is asked to place G-tube. Chart, PMHx, meds, labs, imaging reviewed. Pt just had Temp HD cath placed by this IR service this am.  Past Medical History:  Diagnosis Date  . CVA (cerebral infarction) 01/2016  . DM (diabetes mellitus) (HCC)   . HTN (hypertension)   . Ischemic colitis (HCC) 01/2016    Past Surgical History:  Procedure Laterality Date  . ILEOSTOMY  01/2016  . IR GENERIC HISTORICAL  02/17/2016   IR FLUORO GUIDE CV LINE RIGHT 02/17/2016 Gilmer Mor, DO [facility]  . IR GENERIC HISTORICAL  02/17/2016   IR US GUIDE VASC ACCESS RIGHT 02/17/2016 Gilmer Mor, DO [facility]  . TOTAL COLECTOMY  01/2016    Allergies: Review of patient's allergies indicates no known allergies.  Medications: Prior to Admission medications   Medication Sig Start Date End Date Taking? Authorizing Provider  acetaminophen (TYLENOL) 325 MG tablet Take 325-650 mg by mouth every 6 (six) hours as needed for mild pain, moderate pain or headache.    Historical Provider, MD  antiseptic oral rinse (CPC / CETYLPYRIDINIUM CHLORIDE 0.05%) 0.05 % LIQD solution 7  mLs by Mouth Rinse route 2 times daily at 12 noon and 4 pm. 02/10/16   Jeanella Craze, NP  chlorhexidine (PERIDEX) 0.12 % solution 15 mLs by Mouth Rinse route 2 (two) times daily. 02/10/16   Jeanella Craze, NP  Chlorhexidine Gluconate Cloth 2 % PADS Apply 6 each topically daily at 6 (six) AM. 02/10/16   Jeanella Craze, NP  Dextrose-Sodium Chloride (DEXTROSE 5 % AND 0.45% NACL) infusion Inject 50 mL/hr into the vein continuous. 02/10/16   Jeanella Craze, NP  famotidine (PEPCID) 20-0.9 MG/50ML-% Inject 50 mLs (20 mg total) into the vein daily. 02/10/16   Jeanella Craze, NP  hydrocortisone sodium succinate (SOLU-CORTEF) 100 MG SOLR injection Inject 1 mL (50 mg total) into the vein every 6 (six) hours. 02/10/16   Jeanella Craze, NP  insulin aspart (NOVOLOG) 100 UNIT/ML injection Inject 0-9 Units into the skin every 4 (four) hours. 02/10/16   Jeanella Craze, NP  mupirocin ointment (BACTROBAN) 2 % Place 1 application into the nose 2 (two) times daily. 02/10/16   Jeanella Craze, NP  Nutritional Supplements (FEEDING SUPPLEMENT, VITAL AF 1.2 CAL,) LIQD Place 1,000 mLs into feeding tube continuous. 02/10/16   Jeanella Craze, NP  piperacillin-tazobactam (ZOSYN) 2-0.25 GM/50ML IVPB Inject 50 mLs (2.25 g total) into the vein every 8 (eight) hours. 02/10/16   Jeanella Craze, NP     History reviewed. No pertinent family history.  Social History   Social History  . Marital status: Married    Spouse name: N/A  . Number  of children: N/A  . Years of education: N/A   Social History Main Topics  . Smoking status: None  . Smokeless tobacco: None  . Alcohol use None  . Drug use: Unknown  . Sexual activity: Not Asked   Other Topics Concern  . None   Social History Narrative  . None    Review of Systems: A 12 point ROS discussed and pertinent positives are indicated in the HPI above.  All other systems are negative.  Review of Systems  Vital Signs: Temp: 97.2, HR: 89, BP: 146/69, RR: 18  Physical Exam    Constitutional: He appears well-developed. No distress.  HENT:  Head: Normocephalic.  Mouth/Throat: Oropharynx is clear and moist.  NGT in right nare  Neck: Normal range of motion. No JVD present. No tracheal deviation present.  (R)IJ temp HD cath intact  Cardiovascular: Normal rate, regular rhythm and normal heart sounds.   Pulmonary/Chest: Effort normal and breath sounds normal. No respiratory distress. He has no wheezes.  Skin: Skin is warm.  Psychiatric:  Pt awake, but not oriented. Nods head yes to about everything I said.     Mallampati Score:  MD Evaluation Airway: WNL Heart: WNL Abdomen: WNL Chest/ Lungs: WNL ASA  Classification: 3 Mallampati/Airway Score: Two   Labs:  CBC:  Recent Labs  02/10/16 0400 02/11/16 0538 02/14/16 1143 02/15/16 0845  WBC 9.7 11.6* 16.8* 10.9*  HGB 7.6* 8.1* 8.5* 7.4*  HCT 23.4* 24.2* 25.4* 22.6*  PLT 163 170 168 145*    COAGS:  Recent Labs  02/08/16 0409 02/09/16 0330 02/11/16 0538  INR  --   --  1.09  APTT 40* 142*  --     BMP:  Recent Labs  02/12/16 0710 02/14/16 1143 02/15/16 0845 02/16/16 1021  NA 139 136 139 138  K 3.9 3.8 3.3* 3.6  CL 107 103 109 106  CO2 20* 19* 21* 21*  GLUCOSE 124* 187* 85 126*  BUN 115* 86* 40* 58*  CALCIUM 8.7* 8.8* 7.5* 8.3*  CREATININE 5.25* 3.88* 2.42* 3.18*  GFRNONAA 10* 15* 26* 19*  GFRAA 12* 17* 30* 22*    LIVER FUNCTION TESTS:  Recent Labs  01/31/16 1809  02/09/16 1600 02/11/16 0538 02/14/16 1143 02/16/16 1021  BILITOT 0.5  --   --  0.5  --   --   AST 32  --   --  51*  --   --   ALT 58  --   --  120*  --   --   ALKPHOS 146*  --   --  202*  --   --   PROT 6.2*  --   --  5.3*  --   --   ALBUMIN 3.0*  < > 2.0* 2.2* 2.4* 2.2*  < > = values in this interval not displayed.  TUMOR MARKERS: No results for input(s): AFPTM, CEA, CA199, CHROMGRNA in the last 8760 hours.  Assessment and Plan: Dysphagia secondary to CVA Abdominal CT reviewed, anatomy appears  compatible for percutaneous approach G-tube. Labs reviewed. WBC returning to normal after steroid dosing. Remains on Zosyn q8h, but afebrile Discussed G-tube placement with wife. Risks and Benefits discussed with the patient's wife including, but not limited to the need for a barium enema during the procedure, bleeding, infection, peritonitis, or damage to adjacent structures. All questions were answered, patient's wife is agreeable to proceed. Consent signed and in chart.    Thank you for this interesting consult.  I greatly enjoyed meeting  Brett Jenkins and look forward to participating in their care.  A copy of this report was sent to the requesting provider on this date.  Electronically Signed: Brayton ElBRUNING, Chanon Loney 02/17/2016, 3:19 PM   I spent a total of 20 minutes in face to face in clinical consultation, greater than 50% of which was counseling/coordinating care for G-tube placement

## 2016-02-18 LAB — RENAL FUNCTION PANEL
ALBUMIN: 2.3 g/dL — AB (ref 3.5–5.0)
ANION GAP: 10 (ref 5–15)
BUN: 75 mg/dL — ABNORMAL HIGH (ref 6–20)
CALCIUM: 8.6 mg/dL — AB (ref 8.9–10.3)
CHLORIDE: 112 mmol/L — AB (ref 101–111)
CO2: 17 mmol/L — AB (ref 22–32)
Creatinine, Ser: 3.34 mg/dL — ABNORMAL HIGH (ref 0.61–1.24)
GFR, EST AFRICAN AMERICAN: 20 mL/min — AB (ref >60)
GFR, EST NON AFRICAN AMERICAN: 18 mL/min — AB (ref >60)
Glucose, Bld: 82 mg/dL (ref 65–99)
Phosphorus: 3.5 mg/dL (ref 2.5–4.6)
Potassium: 4.3 mmol/L (ref 3.5–5.1)
SODIUM: 139 mmol/L (ref 135–145)

## 2016-02-18 LAB — COMPREHENSIVE METABOLIC PANEL
ALBUMIN: 2.3 g/dL — AB (ref 3.5–5.0)
ALK PHOS: 146 U/L — AB (ref 38–126)
ALT: 312 U/L — ABNORMAL HIGH (ref 17–63)
ANION GAP: 11 (ref 5–15)
AST: 62 U/L — AB (ref 15–41)
BUN: 75 mg/dL — AB (ref 6–20)
CALCIUM: 8.6 mg/dL — AB (ref 8.9–10.3)
CO2: 16 mmol/L — AB (ref 22–32)
Chloride: 111 mmol/L (ref 101–111)
Creatinine, Ser: 3.39 mg/dL — ABNORMAL HIGH (ref 0.61–1.24)
GFR calc Af Amer: 20 mL/min — ABNORMAL LOW (ref >60)
GFR calc non Af Amer: 17 mL/min — ABNORMAL LOW (ref >60)
GLUCOSE: 82 mg/dL (ref 65–99)
Potassium: 4.3 mmol/L (ref 3.5–5.1)
SODIUM: 138 mmol/L (ref 135–145)
Total Bilirubin: 0.5 mg/dL (ref 0.3–1.2)
Total Protein: 4.9 g/dL — ABNORMAL LOW (ref 6.5–8.1)

## 2016-02-18 LAB — CBC
HEMATOCRIT: 23.8 % — AB (ref 39.0–52.0)
HEMOGLOBIN: 7.8 g/dL — AB (ref 13.0–17.0)
MCH: 32.4 pg (ref 26.0–34.0)
MCHC: 32.8 g/dL (ref 30.0–36.0)
MCV: 98.8 fL (ref 78.0–100.0)
Platelets: 190 10*3/uL (ref 150–400)
RBC: 2.41 MIL/uL — AB (ref 4.22–5.81)
RDW: 14 % (ref 11.5–15.5)
WBC: 10.6 10*3/uL — AB (ref 4.0–10.5)

## 2016-02-18 NOTE — Progress Notes (Signed)
[facility]  ROUNDING NOTE   Subjective:  Patient remains critically ill UOP ~  2100 cc. Ostomy output 750 cc BUN/Cr elevated compared to yesterday Patient pulled out his temp dialysis catheter 2nd time    Objective:  Vital signs in last 24 hours:    Temp 98.1  Pulse 84  Resp 16  BP 185/87  Physical Exam: General: Critically ill-appearing.  Head: Normocephalic, atraumatic. NG tube in place  Eyes: Anicteric  Neck: Supple, trachea midline  Lungs:  Scattered rhonchi, normal effort  Heart: S1S2 no rubs  Abdomen:  Midline abdominal incision noted. Colostomy noted.  Extremities: 1+ peripheral edema. Especially in arms  Neurologic: will answer simple yes/no  questions.  Skin: No acute lesions  Access: No dialysis cathter at present    Basic Metabolic Panel:  Recent Labs Lab 02/12/16 0710 02/14/16 1143 02/15/16 0845 02/16/16 1021 02/18/16 0528  NA 139 136 139 138 138  139  K 3.9 3.8 3.3* 3.6 4.3  4.3  CL 107 103 109 106 111  112*  CO2 20* 19* 21* 21* 16*  17*  GLUCOSE 124* 187* 85 126* 82  82  BUN 115* 86* 40* 58* 75*  75*  CREATININE 5.25* 3.88* 2.42* 3.18* 3.39*  3.34*  CALCIUM 8.7* 8.8* 7.5* 8.3* 8.6*  8.6*  PHOS  --  4.1  --  3.5 3.5    Liver Function Tests:  Recent Labs Lab 02/14/16 1143 02/16/16 1021 02/18/16 0528  AST  --   --  62*  ALT  --   --  312*  ALKPHOS  --   --  146*  BILITOT  --   --  0.5  PROT  --   --  4.9*  ALBUMIN 2.4* 2.2* 2.3*  2.3*   No results for input(s): LIPASE, AMYLASE in the last 168 hours. No results for input(s): AMMONIA in the last 168 hours.  CBC:  Recent Labs Lab 02/14/16 1143 02/15/16 0845 02/18/16 0528  WBC 16.8* 10.9* 10.6*  HGB 8.5* 7.4* 7.8*  HCT 25.4* 22.6* 23.8*  MCV 98.4 98.7 98.8  PLT 168 145* 190    Cardiac Enzymes: No results for input(s): CKTOTAL, CKMB, CKMBINDEX, TROPONINI in the last 168 hours.  BNP: Invalid input(s): POCBNP  CBG: No results for input(s): GLUCAP in the  last 168 hours.  Microbiology: Results for orders placed or performed during the hospital encounter of 02/10/16  Culture, blood (routine x 2)     Status: None (Preliminary result)   Collection Time: 02/14/16  7:23 PM  Result Value Ref Range Status   Specimen Description BLOOD HEMODIALYSIS CATHETER ARTERIAL  Final   Special Requests BOTTLES DRAWN AEROBIC AND ANAEROBIC  10CC EA  Final   Culture NO GROWTH 4 DAYS  Final   Report Status PENDING  Incomplete  Culture, blood (routine x 2)     Status: None (Preliminary result)   Collection Time: 02/14/16  7:32 PM  Result Value Ref Range Status   Specimen Description BLOOD BLOOD RIGHT ARM  Final   Special Requests BOTTLES DRAWN AEROBIC AND ANAEROBIC 5CC EA  Final   Culture NO GROWTH 4 DAYS  Final   Report Status PENDING  Incomplete    Coagulation Studies: No results for input(s): LABPROT, INR in the last 72 hours.  Urinalysis: No results for input(s): COLORURINE, LABSPEC, PHURINE, GLUCOSEU, HGBUR, BILIRUBINUR, KETONESUR, PROTEINUR, UROBILINOGEN, NITRITE, LEUKOCYTESUR in the last 72 hours.  Invalid input(s): APPERANCEUR    Imaging: Ir Fluoro Guide Cv Line  Right  Result Date: 02/17/2016 INDICATION: 65 year old male with acute renal failure. EXAM: TUNNELED CENTRAL VENOUS HEMODIALYSIS CATHETER PLACEMENT WITH ULTRASOUND AND FLUOROSCOPIC GUIDANCE MEDICATIONS: None ANESTHESIA/SEDATION: No moderate sedation FLUOROSCOPY TIME:  Fluoroscopy Time: 0 minutes 42 seconds (3 mGy). COMPLICATIONS: None PROCEDURE: Informed written consent was obtained from the patient and the patient's family after a thorough discussion of the procedural risks, benefits and alternatives. All questions were addressed. A timeout was performed prior to the initiation of the procedure. The right neck and chest was prepped with chlorhexidine, and draped in the usual sterile fashion using maximum barrier technique (cap and mask, sterile gown, sterile gloves, large sterile sheet, hand  hygiene and cutaneous antiseptic). Local anesthesia was attained by infiltration with 1% lidocaine without epinephrine. Ultrasound demonstrated partial occlusion of the right internal jugular vein, and this was documented with an image. Under real-time ultrasound guidance, this vein was accessed with a 21 gauge micropuncture needle and image documentation was performed. A small dermatotomy was made at the access site with an 11 scalpel. A 0.018" Nitrex wire was advanced into the SVC and the access needle exchanged for a 71F micropuncture vascular sheath. The 0.018" wire was then removed and a 0.035" wire advanced into the IVC. Upon withdrawal of the 018 wire, the wire was marked for appropriate length of the internal portion of the catheter. A 20 cm catheter was selected. Skin and subcutaneous tissues were serially dilated. Catheter was placed on the wire. The catheter tip is positioned in the upper right atrium. This was documented with a spot image. Both ports of the hemodialysis catheter were then tested for excellent function. The ports were then locked with heparinized lock. Patient tolerated the procedure well and remained hemodynamically stable throughout. No complications were encountered and no significant blood loss was encountered. IMPRESSION: Status post temporary right IJ hemodialysis catheter. Catheter ready for use. Signed, Yvone NeuJaime S. Loreta AveWagner, DO [facility] Electronically Signed   By: Gilmer MorJaime  Wagner D.O.   On: 02/17/2016 10:58   Ir Koreas Guide Vasc Access Right  Result Date: 02/17/2016 INDICATION: 65 year old male with acute renal failure. EXAM: TUNNELED CENTRAL VENOUS HEMODIALYSIS CATHETER PLACEMENT WITH ULTRASOUND AND FLUOROSCOPIC GUIDANCE MEDICATIONS: None ANESTHESIA/SEDATION: No moderate sedation FLUOROSCOPY TIME:  Fluoroscopy Time: 0 minutes 42 seconds (3 mGy). COMPLICATIONS: None PROCEDURE: Informed written consent was obtained from the  patient and the patient's family after a thorough discussion of the procedural risks, benefits and alternatives. All questions were addressed. A timeout was performed prior to the initiation of the procedure. The right neck and chest was prepped with chlorhexidine, and draped in the usual sterile fashion using maximum barrier technique (cap and mask, sterile gown, sterile gloves, large sterile sheet, hand hygiene and cutaneous antiseptic). Local anesthesia was attained by infiltration with 1% lidocaine without epinephrine. Ultrasound demonstrated partial occlusion of the right internal jugular vein, and this was documented with an image. Under real-time ultrasound guidance, this vein was accessed with a 21 gauge micropuncture needle and image documentation was performed. A small dermatotomy was made at the access site with an 11 scalpel. A 0.018" Nitrex wire was advanced into the SVC and the access needle exchanged for a 71F micropuncture vascular sheath. The 0.018" wire was then removed and a 0.035" wire advanced into the IVC. Upon withdrawal of the 018 wire, the wire was marked for appropriate length of the internal portion of the catheter. A 20 cm catheter was selected. Skin and subcutaneous tissues were serially dilated. Catheter  was placed on the wire. The catheter tip is positioned in the upper right atrium. This was documented with a spot image. Both ports of the hemodialysis catheter were then tested for excellent function. The ports were then locked with heparinized lock. Patient tolerated the procedure well and remained hemodynamically stable throughout. No complications were encountered and no significant blood loss was encountered. IMPRESSION: Status post temporary right IJ hemodialysis catheter. Catheter ready for use. Signed, Yvone NeuJaime S. Loreta AveWagner, DO [facility] Electronically Signed   By: Gilmer MorJaime  Wagner D.O.   On: 02/17/2016 10:58      Medications:       Assessment/ Plan:  65 y.o. male  with a PMHx of diabetes mellitus type 2, hypertension, dysphagia, history of ischemic left MCA stroke, obesity, who was admitted to [facility] on 01/31/2016 for ongoing medical management status post recent ischemic left MCA stroke, dysphasia.  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  On 01/24/2016 unfortunately he suffered a left MCA distribution CVA with hemorrhage in the left basal ganglia.  We are asked now to see him for acute renal failure.  1.  Acute renal failure, multifactorial with contributions from hypotension, diuretics, ACEi - ? Baseline 1.20/GFR 62 from 01/31/16 (care everywhere) 2.  Anemia unspecified. 3.  Metabolic acidosis, previously on metformin 4.  Hypotension previously requiring pressors. 5.  Others- Left MCA stroke, left basal ganglia hemorrhage, ischemic colitis s/p subtotal colectomy with colostomy 01/20/16 Plan:  Remains critically ill UOP ~ 2100 cc  BUN likely elevated due to high dose steroids and renal failure Dialysis catheter to be placed by VIR- Permcath- planned for monday Appreciate VIR assistance    LOS: 0 Annelies Coyt 8/18/20174:01 PM

## 2016-02-19 LAB — CULTURE, BLOOD (ROUTINE X 2)
Culture: NO GROWTH
Culture: NO GROWTH

## 2016-02-20 LAB — BASIC METABOLIC PANEL
ANION GAP: 6 (ref 5–15)
BUN: 75 mg/dL — AB (ref 6–20)
CALCIUM: 8.7 mg/dL — AB (ref 8.9–10.3)
CO2: 16 mmol/L — AB (ref 22–32)
CREATININE: 3.27 mg/dL — AB (ref 0.61–1.24)
Chloride: 118 mmol/L — ABNORMAL HIGH (ref 101–111)
GFR calc Af Amer: 21 mL/min — ABNORMAL LOW (ref >60)
GFR, EST NON AFRICAN AMERICAN: 18 mL/min — AB (ref >60)
GLUCOSE: 133 mg/dL — AB (ref 65–99)
Potassium: 4.1 mmol/L (ref 3.5–5.1)
Sodium: 140 mmol/L (ref 135–145)

## 2016-02-20 LAB — CBC
HCT: 26.2 % — ABNORMAL LOW (ref 39.0–52.0)
Hemoglobin: 8.5 g/dL — ABNORMAL LOW (ref 13.0–17.0)
MCH: 32.9 pg (ref 26.0–34.0)
MCHC: 32.4 g/dL (ref 30.0–36.0)
MCV: 101.6 fL — ABNORMAL HIGH (ref 78.0–100.0)
PLATELETS: 215 10*3/uL (ref 150–400)
RBC: 2.58 MIL/uL — ABNORMAL LOW (ref 4.22–5.81)
RDW: 14.7 % (ref 11.5–15.5)
WBC: 12.7 10*3/uL — ABNORMAL HIGH (ref 4.0–10.5)

## 2016-02-20 NOTE — Progress Notes (Signed)
Patient ID: Brett DyDallas F Jenkins, male   DOB: 03/07/1951, 65 y.o.   MRN: [id number] Pt tent scheduled for G tube and possibly perm HD cath on 8/21; original consult note done 8/17; consent obtained for both procedures; preprocedure orders placed; check am renal fxn panel.

## 2016-02-21 ENCOUNTER — Encounter (HOSPITAL_COMMUNITY): Payer: Self-pay | Admitting: Interventional Radiology

## 2016-02-21 ENCOUNTER — Other Ambulatory Visit (HOSPITAL_COMMUNITY): Payer: Self-pay

## 2016-02-21 HISTORY — PX: IR GENERIC HISTORICAL: IMG1180011

## 2016-02-21 LAB — BASIC METABOLIC PANEL
Anion gap: 7 (ref 5–15)
BUN: 71 mg/dL — ABNORMAL HIGH (ref 6–20)
CALCIUM: 9 mg/dL (ref 8.9–10.3)
CHLORIDE: 121 mmol/L — AB (ref 101–111)
CO2: 17 mmol/L — ABNORMAL LOW (ref 22–32)
Creatinine, Ser: 3.02 mg/dL — ABNORMAL HIGH (ref 0.61–1.24)
GFR calc Af Amer: 23 mL/min — ABNORMAL LOW (ref >60)
GFR calc non Af Amer: 20 mL/min — ABNORMAL LOW (ref >60)
GLUCOSE: 85 mg/dL (ref 65–99)
POTASSIUM: 4.1 mmol/L (ref 3.5–5.1)
Sodium: 145 mmol/L (ref 135–145)

## 2016-02-21 LAB — CBC
HCT: 26.9 % — ABNORMAL LOW (ref 39.0–52.0)
HEMOGLOBIN: 8.5 g/dL — AB (ref 13.0–17.0)
MCH: 32.1 pg (ref 26.0–34.0)
MCHC: 31.6 g/dL (ref 30.0–36.0)
MCV: 101.5 fL — AB (ref 78.0–100.0)
Platelets: 230 10*3/uL (ref 150–400)
RBC: 2.65 MIL/uL — AB (ref 4.22–5.81)
RDW: 14.8 % (ref 11.5–15.5)
WBC: 10 10*3/uL (ref 4.0–10.5)

## 2016-02-21 LAB — PROTIME-INR
INR: 1.15
Prothrombin Time: 14.8 seconds (ref 11.4–15.2)

## 2016-02-21 MED ORDER — MIDAZOLAM HCL 2 MG/2ML IJ SOLN
INTRAMUSCULAR | Status: AC | PRN
  Administered 2016-02-21 (×2): 1 mg via INTRAVENOUS

## 2016-02-21 MED ORDER — IOPAMIDOL (ISOVUE-300) INJECTION 61%
INTRAVENOUS | Status: AC
  Administered 2016-02-21: 20 mL via GASTROSTOMY
  Filled 2016-02-21: qty 50

## 2016-02-21 MED ORDER — FENTANYL CITRATE (PF) 100 MCG/2ML IJ SOLN
INTRAMUSCULAR | Status: AC
  Filled 2016-02-21: qty 2

## 2016-02-21 MED ORDER — FENTANYL CITRATE (PF) 100 MCG/2ML IJ SOLN
INTRAMUSCULAR | Status: AC | PRN
  Administered 2016-02-21: 75 ug via INTRAVENOUS
  Administered 2016-02-21: 25 ug via INTRAVENOUS

## 2016-02-21 MED ORDER — CEFAZOLIN SODIUM-DEXTROSE 2-4 GM/100ML-% IV SOLN
INTRAVENOUS | Status: AC
  Administered 2016-02-21: 2000 mg
  Filled 2016-02-21: qty 100

## 2016-02-21 MED ORDER — LIDOCAINE HCL 1 % IJ SOLN
INTRAMUSCULAR | Status: AC
  Filled 2016-02-21: qty 20

## 2016-02-21 MED ORDER — LIDOCAINE HCL 1 % IJ SOLN
INTRAMUSCULAR | Status: AC | PRN
  Administered 2016-02-21: 10 mL

## 2016-02-21 MED ORDER — MIDAZOLAM HCL 2 MG/2ML IJ SOLN
INTRAMUSCULAR | Status: AC
  Filled 2016-02-21: qty 2

## 2016-02-21 NOTE — Sedation Documentation (Signed)
Patient is resting comfortably. 

## 2016-02-21 NOTE — Procedures (Signed)
Interventional Radiology Procedure Note  Procedure: Placement of percutaneous 20F pull-through gastrostomy tube. Complications: None Recommendations: - NPO except for sips and chips remainder of today and overnight - Maintain G-tube to LWS until tomorrow morning  - May advance diet as tolerated and begin using tube tomorrow morning  Signed,   Marty Sadlowski S. Ericson Nafziger, DO   

## 2016-02-21 NOTE — Progress Notes (Signed)
[facility]  ROUNDING NOTE   Subjective:  Patient remains ill UOP ~  1650 cc. Ostomy output 350 cc BUN/Cr have started to improve Dialysis cathter was not placed  PEG tube placed earlier today  Objective:  Vital signs in last 24 hours:  Pulse Rate:  [114-148] 148 (08/21 1352) Resp:  [11-17] 17 (08/21 1352) BP: (134-185)/(78-110) 185/110 (08/21 1352) SpO2:  [99 %-100 %] 100 % (08/21 1352)   Physical Exam: General: Critically ill-appearing.  Head: Normocephalic, atraumatic. NG tube in place  Eyes: Anicteric  Neck: Supple, trachea midline  Lungs:  Scattered rhonchi, normal effort  Heart: S1S2 no rubs  Abdomen:  Midline abdominal incision noted. Colostomy noted.  Extremities: 1+ peripheral edema. in arms  Neurologic: will answer simple yes/no  questions.  Skin: No acute lesions  Access: No dialysis cathter at present    Basic Metabolic Panel:  Recent Labs Lab 02/15/16 0845 02/16/16 1021 02/18/16 0528 02/20/16 1319 02/21/16 1003  NA 139 138 138  139 140 145  K 3.3* 3.6 4.3  4.3 4.1 4.1  CL 109 106 111  112* 118* 121*  CO2 21* 21* 16*  17* 16* 17*  GLUCOSE 85 126* 82  82 133* 85  BUN 40* 58* 75*  75* 75* 71*  CREATININE 2.42* 3.18* 3.39*  3.34* 3.27* 3.02*  CALCIUM 7.5* 8.3* 8.6*  8.6* 8.7* 9.0  PHOS  --  3.5 3.5  --   --     Liver Function Tests:  Recent Labs Lab 02/16/16 1021 02/18/16 0528  AST  --  62*  ALT  --  312*  ALKPHOS  --  146*  BILITOT  --  0.5  PROT  --  4.9*  ALBUMIN 2.2* 2.3*  2.3*   No results for input(s): LIPASE, AMYLASE in the last 168 hours. No results for input(s): AMMONIA in the last 168 hours.  CBC:  Recent Labs Lab 02/15/16 0845 02/18/16 0528 02/20/16 1319 02/21/16 1003  WBC 10.9* 10.6* 12.7* 10.0  HGB 7.4* 7.8* 8.5* 8.5*  HCT 22.6* 23.8* 26.2* 26.9*  MCV 98.7 98.8 101.6* 101.5*  PLT 145* 190 215 230    Cardiac Enzymes: No results for input(s): CKTOTAL, CKMB, CKMBINDEX, TROPONINI in the last  168 hours.  BNP: Invalid input(s): POCBNP  CBG: No results for input(s): GLUCAP in the last 168 hours.  Microbiology: Results for orders placed or performed during the hospital encounter of 02/10/16  Culture, blood (routine x 2)     Status: None   Collection Time: 02/14/16  7:23 PM  Result Value Ref Range Status   Specimen Description BLOOD HEMODIALYSIS CATHETER ARTERIAL  Final   Special Requests BOTTLES DRAWN AEROBIC AND ANAEROBIC  10CC EA  Final   Culture NO GROWTH 5 DAYS  Final   Report Status 02/19/2016 FINAL  Final  Culture, blood (routine x 2)     Status: None   Collection Time: 02/14/16  7:32 PM  Result Value Ref Range Status   Specimen Description BLOOD BLOOD RIGHT ARM  Final   Special Requests BOTTLES DRAWN AEROBIC AND ANAEROBIC 5CC EA  Final   Culture NO GROWTH 5 DAYS  Final   Report Status 02/19/2016 FINAL  Final    Coagulation Studies:  Recent Labs  02/21/16 1003  LABPROT 14.8  INR 1.15    Urinalysis: No results for input(s): COLORURINE, LABSPEC, PHURINE, GLUCOSEU, HGBUR, BILIRUBINUR, KETONESUR, PROTEINUR, UROBILINOGEN, NITRITE, LEUKOCYTESUR in the last 72 hours.  Invalid input(s): APPERANCEUR    Imaging:  Ir Gastrostomy Tube Mod Sed  Result Date: 02/21/2016 INDICATION: 65 year old male with a history of dysphasia EXAM: PERC PLACEMENT GASTROSTOMY MEDICATIONS: 2.0 g Ancef; Antibiotics were administered within 1 hour of the procedure. ANESTHESIA/SEDATION: Versed 2.0 mg IV; Fentanyl 100 mcg IV Moderate Sedation Time:  20 The patient was continuously monitored during the procedure by the interventional radiology nurse under my direct supervision. CONTRAST:  10 cc - administered into the gastric lumen. FLUOROSCOPY TIME:  Fluoroscopy Time: 9 minutes 36 seconds (121 mGy). COMPLICATIONS: None PROCEDURE: Informed written consent was obtained from the patient and the patient's family after a thorough discussion of the procedural risks, benefits and alternatives. All  questions were addressed. Maximal Sterile Barrier Technique was utilized including caps, mask, sterile gowns, sterile gloves, sterile drape, hand hygiene and skin antiseptic. A timeout was performed prior to the initiation of the procedure. The epigastrium was prepped with Betadine in a sterile fashion, and a sterile drape was applied covering the operative field. A sterile gown and sterile gloves were used for the procedure. A 5-French orogastric tube is placed under fluoroscopic guidance. Scout imaging of the abdomen confirms barium within the transverse colon. The stomach was distended with gas. Under fluoroscopic guidance, an 18 gauge needle was utilized to puncture the anterior wall of the body of the stomach. An Amplatz wire was advanced through the needle passing a T fastener into the lumen of the stomach. The T fastener was secured for gastropexy. A 9-French sheath was inserted. A snare was advanced through the 9-French sheath. A Teena DunkBenson was advanced through the orogastric tube. It was snared then pulled out the oral cavity, pulling the snare, as well. The leading edge of the gastrostomy was attached to the snare. It was then pulled down the esophagus and out the percutaneous site. Tube secured in place. Contrast was injected. Patient tolerated the procedure well and remained hemodynamically stable throughout. No complications were encountered and no significant blood loss encountered. IMPRESSION: Status post fluoroscopic placed percutaneous gastrostomy tube, with 20 JamaicaFrench pull-through. Signed, Yvone NeuJaime S. Loreta AveWagner, DO [facility] Electronically Signed   By: Gilmer MorJaime  Wagner D.O.   On: 02/21/2016 14:07     Medications:       Assessment/ Plan:  65 y.o. male  with a PMHx of diabetes mellitus type 2, hypertension, dysphagia, history of ischemic left MCA stroke, obesity, who was admitted to [facility] on 01/31/2016 for ongoing medical management  status post recent ischemic left MCA stroke, dysphasia.  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  On 01/24/2016 unfortunately he suffered a left MCA distribution CVA with hemorrhage in the left basal ganglia.  We are asked now to see him for acute renal failure.  1.  Acute renal failure, multifactorial with contributions from hypotension, diuretics, ACEi - ? Baseline 1.20/GFR 62 from 01/31/16 (care everywhere) 2.  Anemia unspecified. 3.  Metabolic acidosis, previously on metformin 4.  Hypotension previously requiring pressors. 5.  Others- Left MCA stroke, left basal ganglia hemorrhage, ischemic colitis s/p subtotal colectomy with colostomy 01/20/16 Plan:  Remains critically ill UOP  Fair.  Last HD was 1 week ago on Monday Electrolytes and Volume status are acceptable No acute indication for Dialysis at present Continue to monitor closely    LOS: 0 Paighton Godette 8/21/20175:14 PM

## 2016-02-22 LAB — BASIC METABOLIC PANEL
ANION GAP: 11 (ref 5–15)
BUN: 67 mg/dL — ABNORMAL HIGH (ref 6–20)
CHLORIDE: 121 mmol/L — AB (ref 101–111)
CO2: 14 mmol/L — AB (ref 22–32)
Calcium: 9.2 mg/dL (ref 8.9–10.3)
Creatinine, Ser: 2.94 mg/dL — ABNORMAL HIGH (ref 0.61–1.24)
GFR calc non Af Amer: 21 mL/min — ABNORMAL LOW (ref >60)
GFR, EST AFRICAN AMERICAN: 24 mL/min — AB (ref >60)
Glucose, Bld: 88 mg/dL (ref 65–99)
Potassium: 4.8 mmol/L (ref 3.5–5.1)
Sodium: 146 mmol/L — ABNORMAL HIGH (ref 135–145)

## 2016-02-22 NOTE — Progress Notes (Signed)
Patient ID: Brett Jenkins, male   DOB: 02/07/1951, 65 y.o.   MRN: [id number]    Referring Physician(s): Dr. Carron CurieAli Hijazi  Supervising Physician: Irish LackYamagata, Glenn  Patient Status: inpt  Chief Complaint: dysphagia  Subjective: Patient c/o some soreness around his g-tube, but otherwise no new complaints  Allergies: Review of patient's allergies indicates no known allergies.  Medications: Prior to Admission medications   Medication Sig Start Date End Date Taking? Authorizing Provider  acetaminophen (TYLENOL) 325 MG tablet Take 325-650 mg by mouth every 6 (six) hours as needed for mild pain, moderate pain or headache.    Historical Provider, MD  antiseptic oral rinse (CPC / CETYLPYRIDINIUM CHLORIDE 0.05%) 0.05 % LIQD solution 7 mLs by Mouth Rinse route 2 times daily at 12 noon and 4 pm. 02/10/16   Jeanella CrazeBrandi L Ollis, NP  chlorhexidine (PERIDEX) 0.12 % solution 15 mLs by Mouth Rinse route 2 (two) times daily. 02/10/16   Jeanella CrazeBrandi L Ollis, NP  Chlorhexidine Gluconate Cloth 2 % PADS Apply 6 each topically daily at 6 (six) AM. 02/10/16   Jeanella CrazeBrandi L Ollis, NP  Dextrose-Sodium Chloride (DEXTROSE 5 % AND 0.45% NACL) infusion Inject 50 mL/hr into the vein continuous. 02/10/16   Jeanella CrazeBrandi L Ollis, NP  famotidine (PEPCID) 20-0.9 MG/50ML-% Inject 50 mLs (20 mg total) into the vein daily. 02/10/16   Jeanella CrazeBrandi L Ollis, NP  hydrocortisone sodium succinate (SOLU-CORTEF) 100 MG SOLR injection Inject 1 mL (50 mg total) into the vein every 6 (six) hours. 02/10/16   Jeanella CrazeBrandi L Ollis, NP  insulin aspart (NOVOLOG) 100 UNIT/ML injection Inject 0-9 Units into the skin every 4 (four) hours. 02/10/16   Jeanella CrazeBrandi L Ollis, NP  mupirocin ointment (BACTROBAN) 2 % Place 1 application into the nose 2 (two) times daily. 02/10/16   Jeanella CrazeBrandi L Ollis, NP  Nutritional Supplements (FEEDING SUPPLEMENT, VITAL AF 1.2 CAL,) LIQD Place 1,000 mLs into feeding tube continuous. 02/10/16   Jeanella CrazeBrandi L Ollis, NP  piperacillin-tazobactam (ZOSYN) 2-0.25 GM/50ML IVPB Inject  50 mLs (2.25 g total) into the vein every 8 (eight) hours. 02/10/16   Jeanella CrazeBrandi L Ollis, NP    Vital Signs: BP (!) 185/110   Pulse (!) 148   Resp 17   SpO2 100%   Physical Exam: Abd: g-tube in place with phalange tight to skin.  No leakage. No erythema or evidence of infection.  Imaging: Ir Gastrostomy Tube Mod Sed  Result Date: 02/21/2016 INDICATION: 65 year old male with a history of dysphasia EXAM: PERC PLACEMENT GASTROSTOMY MEDICATIONS: 2.0 g Ancef; Antibiotics were administered within 1 hour of the procedure. ANESTHESIA/SEDATION: Versed 2.0 mg IV; Fentanyl 100 mcg IV Moderate Sedation Time:  20 The patient was continuously monitored during the procedure by the interventional radiology nurse under my direct supervision. CONTRAST:  10 cc - administered into the gastric lumen. FLUOROSCOPY TIME:  Fluoroscopy Time: 9 minutes 36 seconds (121 mGy). COMPLICATIONS: None PROCEDURE: Informed written consent was obtained from the patient and the patient's family after a thorough discussion of the procedural risks, benefits and alternatives. All questions were addressed. Maximal Sterile Barrier Technique was utilized including caps, mask, sterile gowns, sterile gloves, sterile drape, hand hygiene and skin antiseptic. A timeout was performed prior to the initiation of the procedure. The epigastrium was prepped with Betadine in a sterile fashion, and a sterile drape was applied covering the operative field. A sterile gown and sterile gloves were used for the procedure. A 5-French orogastric tube is placed under fluoroscopic guidance. Scout imaging of the abdomen confirms  barium within the transverse colon. The stomach was distended with gas. Under fluoroscopic guidance, an 18 gauge needle was utilized to puncture the anterior wall of the body of the stomach. An Amplatz wire was advanced through the needle passing a T fastener into the lumen of the stomach. The T fastener was secured for gastropexy. A 9-French  sheath was inserted. A snare was advanced through the 9-French sheath. A Teena DunkBenson was advanced through the orogastric tube. It was snared then pulled out the oral cavity, pulling the snare, as well. The leading edge of the gastrostomy was attached to the snare. It was then pulled down the esophagus and out the percutaneous site. Tube secured in place. Contrast was injected. Patient tolerated the procedure well and remained hemodynamically stable throughout. No complications were encountered and no significant blood loss encountered. IMPRESSION: Status post fluoroscopic placed percutaneous gastrostomy tube, with 20 JamaicaFrench pull-through. Signed, Yvone NeuJaime S. Loreta AveWagner, DO [facility] Electronically Signed   By: Gilmer MorJaime  Wagner D.O.   On: 02/21/2016 14:07    Labs:  CBC:  Recent Labs  02/15/16 0845 02/18/16 0528 02/20/16 1319 02/21/16 1003  WBC 10.9* 10.6* 12.7* 10.0  HGB 7.4* 7.8* 8.5* 8.5*  HCT 22.6* 23.8* 26.2* 26.9*  PLT 145* 190 215 230    COAGS:  Recent Labs  02/08/16 0409 02/09/16 0330 02/11/16 0538 02/21/16 1003  INR  --   --  1.09 1.15  APTT 40* 142*  --   --     BMP:  Recent Labs  02/18/16 0528 02/20/16 1319 02/21/16 1003 02/22/16 0551  NA 138  139 140 145 146*  K 4.3  4.3 4.1 4.1 4.8  CL 111  112* 118* 121* 121*  CO2 16*  17* 16* 17* 14*  GLUCOSE 82  82 133* 85 88  BUN 75*  75* 75* 71* 67*  CALCIUM 8.6*  8.6* 8.7* 9.0 9.2  CREATININE 3.39*  3.34* 3.27* 3.02* 2.94*  GFRNONAA 17*  18* 18* 20* 21*  GFRAA 20*  20* 21* 23* 24*    LIVER FUNCTION TESTS:  Recent Labs  01/31/16 1809  02/11/16 0538 02/14/16 1143 02/16/16 1021 02/18/16 0528  BILITOT 0.5  --  0.5  --   --  0.5  AST 32  --  51*  --   --  62*  ALT 58  --  120*  --   --  312*  ALKPHOS 146*  --  202*  --   --  146*  PROT 6.2*  --  5.3*  --   --  4.9*  ALBUMIN 3.0*  < > 2.2* 2.4* 2.2* 2.3*  2.3*  < > = values in this interval not  displayed.  Assessment and Plan: 1. S/p g-tube placement on 8/21 -g-tube in good position.  May use today. -call with questions.  Electronically Signed: Letha CapeSBORNE,Otisha Spickler E 02/22/2016, 9:02 AM   I spent a total of 15 Minutes at the the patient's bedside AND on the patient's hospital floor or unit, greater than 50% of which was counseling/coordinating care for dysphagia

## 2016-02-23 NOTE — Progress Notes (Signed)
[facility]  ROUNDING NOTE   Subjective:  Patient remains ill UOP ~  2300 cc. Ostomy output 400 cc BUN/Cr continue to improve Dialysis cathter was not placed  PEG tube placed monday  Objective:  Vital signs in last 24 hours:    Temperature 97.9, pulse 86, respirations 20, blood pressure 167/67  Physical Exam: General: Critically ill-appearing.  Head: Normocephalic, atraumatic.    Eyes: Anicteric  Neck: Supple,    Lungs:  Scattered rhonchi, normal effort  Heart: S1S2 no rubs  Abdomen:  Midline abdominal incision noted. Colostomy noted.  Extremities: 1+ peripheral edema. in arms  Neurologic: will answer simple yes/no  questions.  Skin: No acute lesions  Access: No dialysis cathter at present    Basic Metabolic Panel:  Recent Labs Lab 02/18/16 0528 02/20/16 1319 02/21/16 1003 02/22/16 0551  NA 138  139 140 145 146*  K 4.3  4.3 4.1 4.1 4.8  CL 111  112* 118* 121* 121*  CO2 16*  17* 16* 17* 14*  GLUCOSE 82  82 133* 85 88  BUN 75*  75* 75* 71* 67*  CREATININE 3.39*  3.34* 3.27* 3.02* 2.94*  CALCIUM 8.6*  8.6* 8.7* 9.0 9.2  PHOS 3.5  --   --   --     Liver Function Tests:  Recent Labs Lab 02/18/16 0528  AST 62*  ALT 312*  ALKPHOS 146*  BILITOT 0.5  PROT 4.9*  ALBUMIN 2.3*  2.3*   No results for input(s): LIPASE, AMYLASE in the last 168 hours. No results for input(s): AMMONIA in the last 168 hours.  CBC:  Recent Labs Lab 02/18/16 0528 02/20/16 1319 02/21/16 1003  WBC 10.6* 12.7* 10.0  HGB 7.8* 8.5* 8.5*  HCT 23.8* 26.2* 26.9*  MCV 98.8 101.6* 101.5*  PLT 190 215 230    Cardiac Enzymes: No results for input(s): CKTOTAL, CKMB, CKMBINDEX, TROPONINI in the last 168 hours.  BNP: Invalid input(s): POCBNP  CBG: No results for input(s): GLUCAP in the last 168 hours.  Microbiology: Results for orders placed or performed during the hospital encounter of 02/10/16  Culture, blood (routine x 2)     Status: None   Collection  Time: 02/14/16  7:23 PM  Result Value Ref Range Status   Specimen Description BLOOD HEMODIALYSIS CATHETER ARTERIAL  Final   Special Requests BOTTLES DRAWN AEROBIC AND ANAEROBIC  10CC EA  Final   Culture NO GROWTH 5 DAYS  Final   Report Status 02/19/2016 FINAL  Final  Culture, blood (routine x 2)     Status: None   Collection Time: 02/14/16  7:32 PM  Result Value Ref Range Status   Specimen Description BLOOD BLOOD RIGHT ARM  Final   Special Requests BOTTLES DRAWN AEROBIC AND ANAEROBIC 5CC EA  Final   Culture NO GROWTH 5 DAYS  Final   Report Status 02/19/2016 FINAL  Final    Coagulation Studies:  Recent Labs  02/21/16 1003  LABPROT 14.8  INR 1.15    Urinalysis: No results for input(s): COLORURINE, LABSPEC, PHURINE, GLUCOSEU, HGBUR, BILIRUBINUR, KETONESUR, PROTEINUR, UROBILINOGEN, NITRITE, LEUKOCYTESUR in the last 72 hours.  Invalid input(s): APPERANCEUR    Imaging: No results found.   Medications:       Assessment/ Plan:  65 y.o. male  with a PMHx of diabetes mellitus type 2, hypertension, dysphagia, history of ischemic left MCA stroke, obesity, who was admitted to [facility] on 01/31/2016 for ongoing medical management status post recent ischemic left MCA stroke, dysphasia.  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  On 01/24/2016 unfortunately he suffered a left MCA distribution CVA with hemorrhage in the left basal ganglia.  We are asked now to see him for acute renal failure.  1.  Acute renal failure, multifactorial with contributions from hypotension, diuretics, ACEi - ? Baseline 1.20/GFR 62 from 01/31/16 (care everywhere) 2.  Anemia unspecified. 3.  Metabolic acidosis, previously on metformin 4.  Hypotension previously requiring pressors. 5.  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  Last HD was > 1 week ago on Monday Electrolytes and Volume status are acceptable No acute indication for Dialysis at present Continue to monitor closely    LOS: 0 Zhaniya Swallows 8/23/20174:42 PM

## 2016-02-25 LAB — BASIC METABOLIC PANEL
Anion gap: 11 (ref 5–15)
BUN: 53 mg/dL — AB (ref 6–20)
CHLORIDE: 110 mmol/L (ref 101–111)
CO2: 16 mmol/L — AB (ref 22–32)
CREATININE: 2.26 mg/dL — AB (ref 0.61–1.24)
Calcium: 9.1 mg/dL (ref 8.9–10.3)
GFR calc non Af Amer: 28 mL/min — ABNORMAL LOW (ref >60)
GFR, EST AFRICAN AMERICAN: 33 mL/min — AB (ref >60)
Glucose, Bld: 87 mg/dL (ref 65–99)
Potassium: 3.8 mmol/L (ref 3.5–5.1)
Sodium: 137 mmol/L (ref 135–145)

## 2016-02-25 LAB — CBC
HCT: 23.5 % — ABNORMAL LOW (ref 39.0–52.0)
Hemoglobin: 7.8 g/dL — ABNORMAL LOW (ref 13.0–17.0)
MCH: 33.2 pg (ref 26.0–34.0)
MCHC: 33.2 g/dL (ref 30.0–36.0)
MCV: 100 fL (ref 78.0–100.0)
Platelets: 173 10*3/uL (ref 150–400)
RBC: 2.35 MIL/uL — ABNORMAL LOW (ref 4.22–5.81)
RDW: 14.7 % (ref 11.5–15.5)
WBC: 7.8 10*3/uL (ref 4.0–10.5)

## 2016-02-25 LAB — RENAL FUNCTION PANEL
ALBUMIN: 2.5 g/dL — AB (ref 3.5–5.0)
Anion gap: 10 (ref 5–15)
BUN: 53 mg/dL — AB (ref 6–20)
CALCIUM: 9.1 mg/dL (ref 8.9–10.3)
CO2: 16 mmol/L — ABNORMAL LOW (ref 22–32)
CREATININE: 2.25 mg/dL — AB (ref 0.61–1.24)
Chloride: 111 mmol/L (ref 101–111)
GFR calc Af Amer: 33 mL/min — ABNORMAL LOW (ref >60)
GFR, EST NON AFRICAN AMERICAN: 28 mL/min — AB (ref >60)
GLUCOSE: 84 mg/dL (ref 65–99)
PHOSPHORUS: 3.3 mg/dL (ref 2.5–4.6)
Potassium: 3.8 mmol/L (ref 3.5–5.1)
SODIUM: 137 mmol/L (ref 135–145)

## 2016-02-25 NOTE — Progress Notes (Signed)
[facility]  ROUNDING NOTE   Subjective:  Patient remains ill UOP ~ 1350 cc. Ostomy in place BUN/Cr continue to improve Results for Brett Jenkins, Brett Jenkins (MRN [id number]) as of 02/25/2016 16:49  Ref. Range 02/21/2016 10:03 02/21/2016 13:51 02/22/2016 05:51 02/25/2016 06:34 02/25/2016 06:34  Creatinine Latest Ref Range: 0.61 - 1.24 mg/dL 4.09 (H)  8.11 (H) 9.14 (H) 2.25 (H)  a    Objective:  Vital signs in last 24 hours:    Temperature 97.2, pulse 77, respirations 22, blood pressure 139/51  Physical Exam: General: Critically ill-appearing.  Head: Normocephalic, atraumatic.    Eyes: Anicteric  Neck: Supple,    Lungs:  Scattered rhonchi, normal effort  Heart: S1S2 no rubs  Abdomen:  Midline abdominal incision noted. Colostomy noted.  Extremities: 1+ peripheral edema. in arms  Neurologic: will answer simple yes/no  questions.  Skin: No acute lesions  Access: No dialysis cathter at present  Foley in place  Basic Metabolic Panel:  Recent Labs Lab 02/20/16 1319 02/21/16 1003 02/22/16 0551 02/25/16 0634  NA 140 145 146* 137  137  K 4.1 4.1 4.8 3.8  3.8  CL 118* 121* 121* 111  110  CO2 16* 17* 14* 16*  16*  GLUCOSE 133* 85 88 84  87  BUN 75* 71* 67* 53*  53*  CREATININE 3.27* 3.02* 2.94* 2.25*  2.26*  CALCIUM 8.7* 9.0 9.2 9.1  9.1  PHOS  --   --   --  3.3    Liver Function Tests:  Recent Labs Lab 02/25/16 0634  ALBUMIN 2.5*   No results for input(s): LIPASE, AMYLASE in the last 168 hours. No results for input(s): AMMONIA in the last 168 hours.  CBC:  Recent Labs Lab 02/20/16 1319 02/21/16 1003 02/25/16 0634  WBC 12.7* 10.0 7.8  HGB 8.5* 8.5* 7.8*  HCT 26.2* 26.9* 23.5*  MCV 101.6* 101.5* 100.0  PLT 215 230 173    Cardiac Enzymes: No results for input(s): CKTOTAL, CKMB, CKMBINDEX, TROPONINI in the last 168 hours.  BNP: Invalid input(s): POCBNP  CBG: No results for input(s): GLUCAP in the last 168 hours.  Microbiology: Results for  orders placed or performed during the hospital encounter of 02/10/16  Culture, blood (routine x 2)     Status: None   Collection Time: 02/14/16  7:23 PM  Result Value Ref Range Status   Specimen Description BLOOD HEMODIALYSIS CATHETER ARTERIAL  Final   Special Requests BOTTLES DRAWN AEROBIC AND ANAEROBIC  10CC EA  Final   Culture NO GROWTH 5 DAYS  Final   Report Status 02/19/2016 FINAL  Final  Culture, blood (routine x 2)     Status: None   Collection Time: 02/14/16  7:32 PM  Result Value Ref Range Status   Specimen Description BLOOD BLOOD RIGHT ARM  Final   Special Requests BOTTLES DRAWN AEROBIC AND ANAEROBIC 5CC EA  Final   Culture NO GROWTH 5 DAYS  Final   Report Status 02/19/2016 FINAL  Final    Coagulation Studies: No results for input(s): LABPROT, INR in the last 72 hours.  Urinalysis: No results for input(s): COLORURINE, LABSPEC, PHURINE, GLUCOSEU, HGBUR, BILIRUBINUR, KETONESUR, PROTEINUR, UROBILINOGEN, NITRITE, LEUKOCYTESUR in the last 72 hours.  Invalid input(s): APPERANCEUR    Imaging: No results found.   Medications:       Assessment/ Plan:  65 y.o. male  with a PMHx of diabetes mellitus type 2, hypertension, dysphagia, history of ischemic left MCA stroke, obesity, who was admitted to [facility] on 01/31/2016 for ongoing medical management status post recent ischemic left MCA stroke, dysphasia.  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  On 01/24/2016 unfortunately he suffered a left MCA distribution CVA with hemorrhage in the left basal ganglia.  We are asked now to see him for acute renal failure.  1.  Acute renal failure, multifactorial with contributions from hypotension, diuretics, ACEi - ? Baseline 1.20/GFR 62 from 01/31/16 and and (care everywhere) 2.  Anemia unspecified. 3.  Metabolic acidosis, previously on metformin 4.  Hypotension previously requiring pressors. 5.  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  Last HD was almost 2 weeks ago Electrolytes and Volume status are acceptable No acute indication for Dialysis at present Continue to monitor closely Add sodium bicarbonate to his regimen    LOS: 0 [name] 8/25/20174:47 PM

## 2016-02-28 LAB — RENAL FUNCTION PANEL
ALBUMIN: 2.5 g/dL — AB (ref 3.5–5.0)
Anion gap: 8 (ref 5–15)
BUN: 40 mg/dL — AB (ref 6–20)
CHLORIDE: 109 mmol/L (ref 101–111)
CO2: 18 mmol/L — ABNORMAL LOW (ref 22–32)
CREATININE: 1.89 mg/dL — AB (ref 0.61–1.24)
Calcium: 9 mg/dL (ref 8.9–10.3)
GFR calc Af Amer: 41 mL/min — ABNORMAL LOW (ref >60)
GFR, EST NON AFRICAN AMERICAN: 35 mL/min — AB (ref >60)
GLUCOSE: 119 mg/dL — AB (ref 65–99)
Phosphorus: 3.4 mg/dL (ref 2.5–4.6)
Potassium: 3.8 mmol/L (ref 3.5–5.1)
Sodium: 135 mmol/L (ref 135–145)

## 2016-05-03 DEATH — deceased

## 2017-07-26 IMAGING — CT CT ABD-PELV W/O CM
2 of 4 series · 15 of 46 positions shown, 17 images · non-contrast
Comparison: No priors.

CLINICAL DATA: 65-year-old male with history of diabetes and
hypertension with history of recent (01/05/2016) left MCA acute
ischemic stroke complicated by right-sided hemiparesis, dysphagia
and global aphasia. Hospitalization complicated by E coli urosepsis,
subsequently with development of ischemic colitis which required
colectomy on 01/20/2016, as well as the ileostomy. After discharge,
patient developed hypotension and progressive renal failure.

EXAM:
CT ABDOMEN AND PELVIS WITHOUT CONTRAST
TECHNIQUE: Multidetector CT imaging of the abdomen and pelvis was performed
following the standard protocol without IV contrast.

[Series 2: a/p w/o 5mm · axial · non-contrast · 0.88mm/px · z∈[+854,+1284]mm · 12 of 94 slices shown, 14 images]
[im 4/94  soft-tissue]
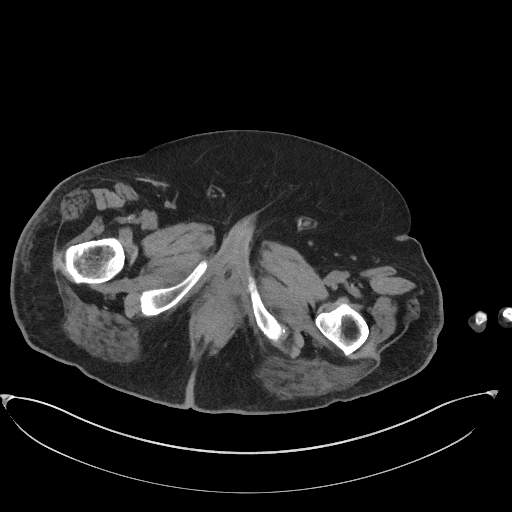
[im 4/94  bone]
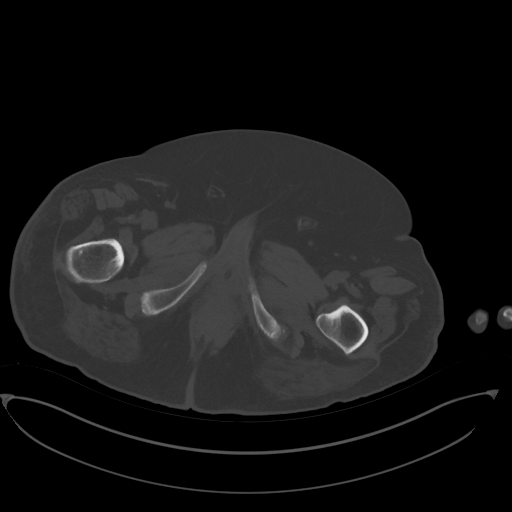
[im 12/94  soft-tissue]
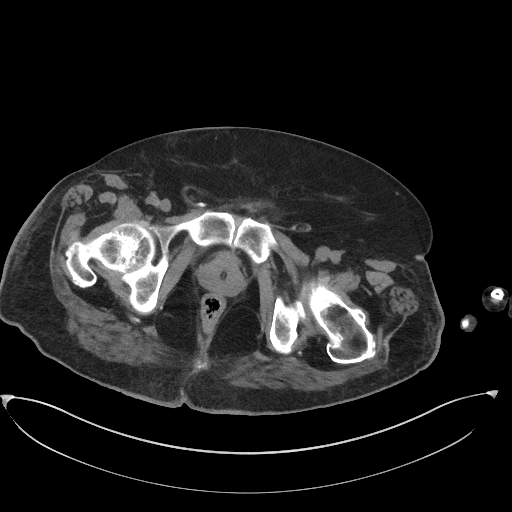
[im 20/94  soft-tissue]
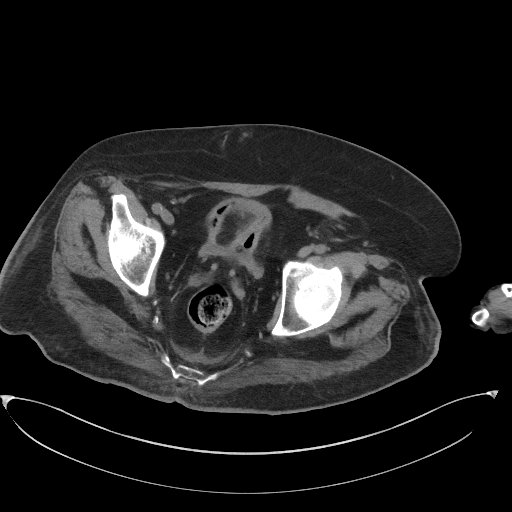
[im 28/94  soft-tissue]
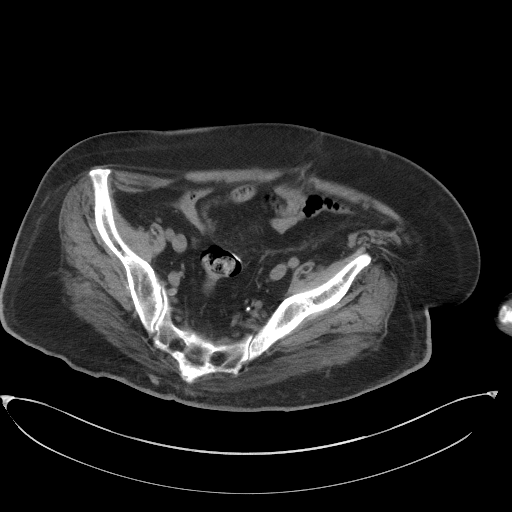
[im 35/94  soft-tissue]
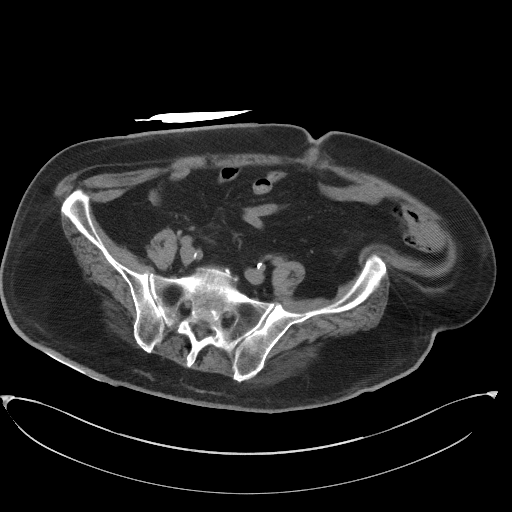
[im 43/94  soft-tissue]
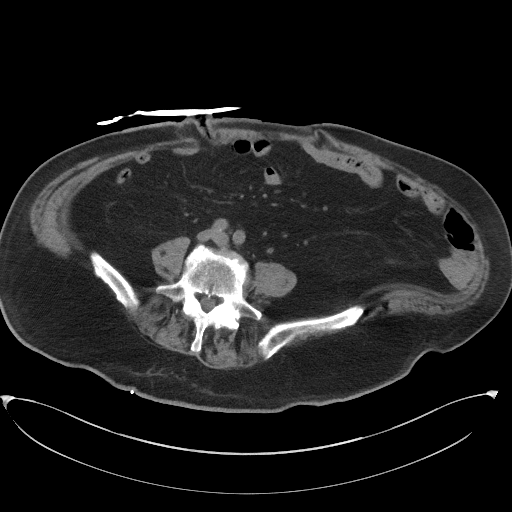
[im 51/94  soft-tissue]
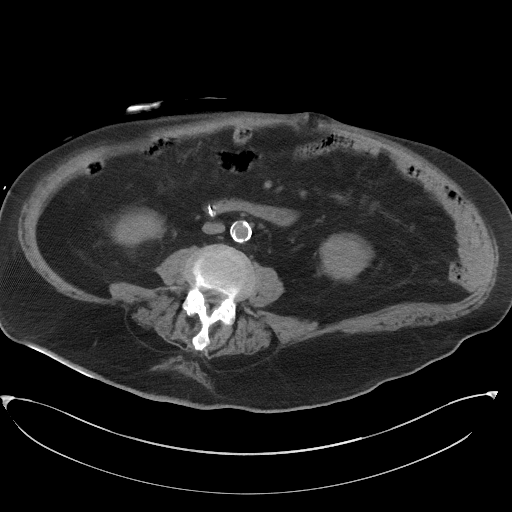
[im 59/94  soft-tissue]
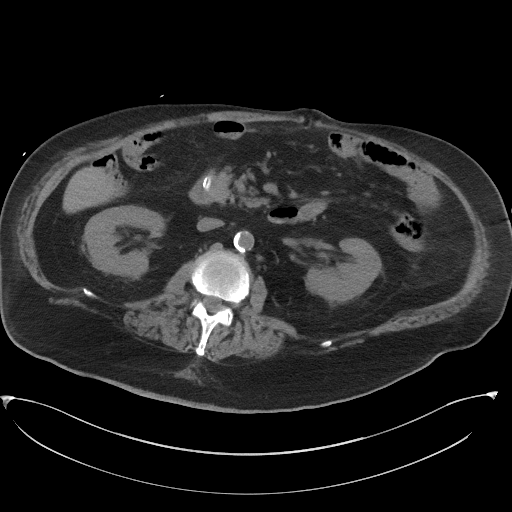
[im 66/94  soft-tissue]
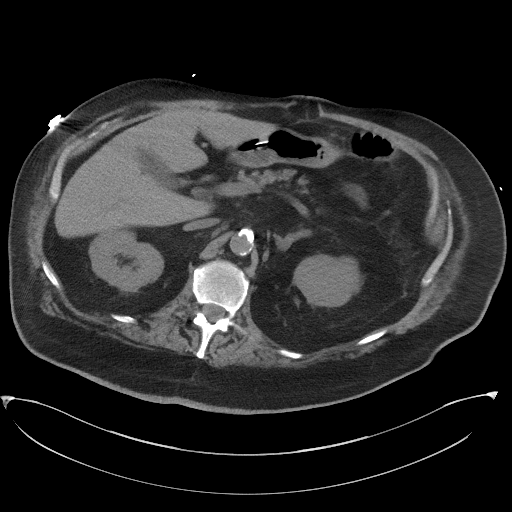
[im 66/94  bone]
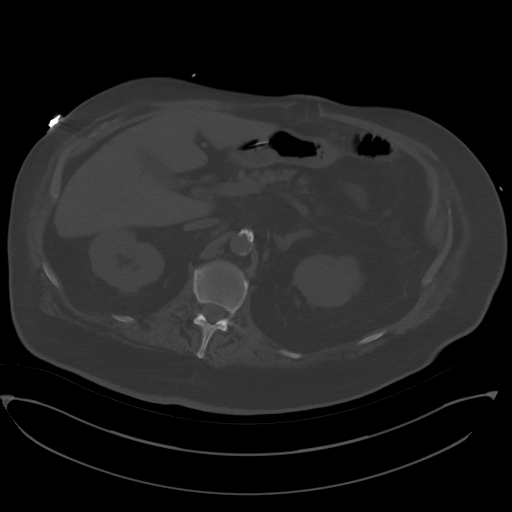
[im 74/94  soft-tissue]
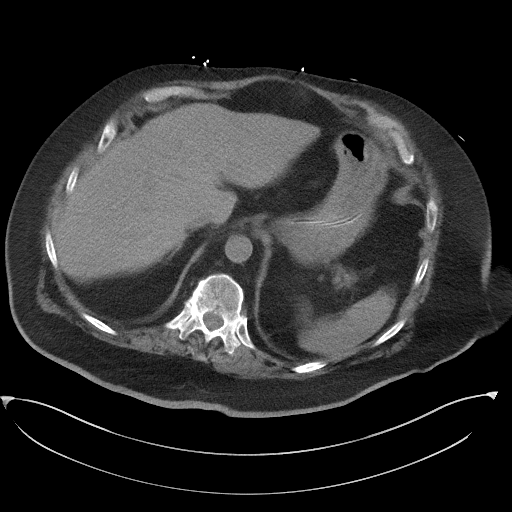
[im 82/94  soft-tissue]
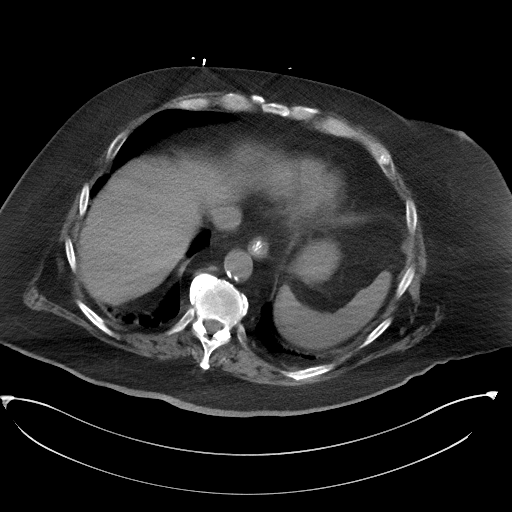
[im 90/94  soft-tissue]
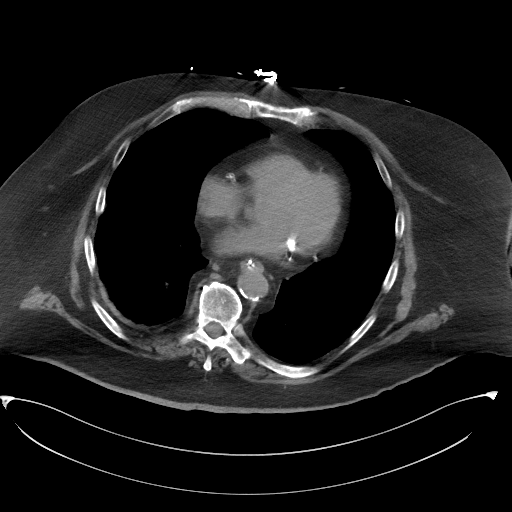

[Series 5: a/p w/o cor · coronal · non-contrast · 0.91mm/px · 3 of 154 slices shown]
[im 52/154  soft-tissue]
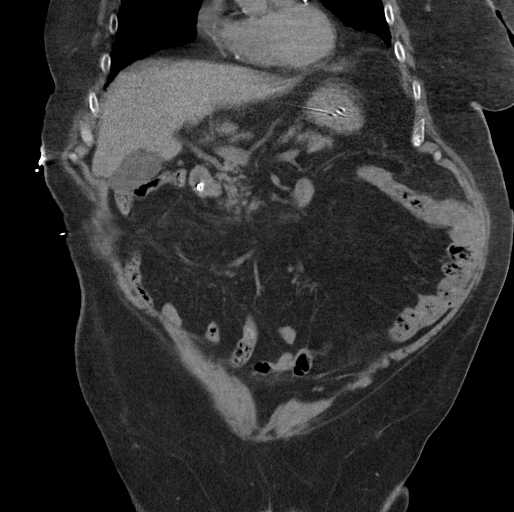
[im 69/154  soft-tissue]
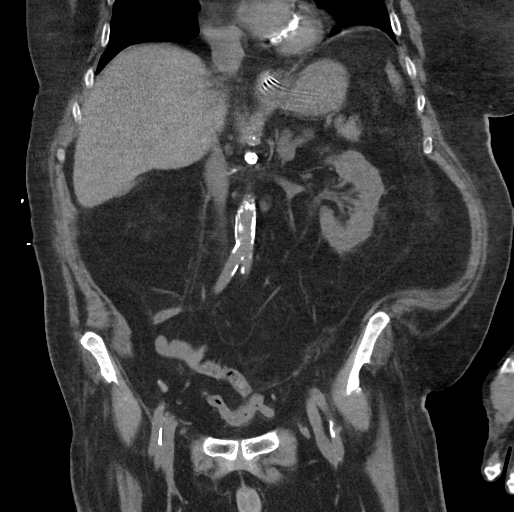
[im 86/154  soft-tissue]
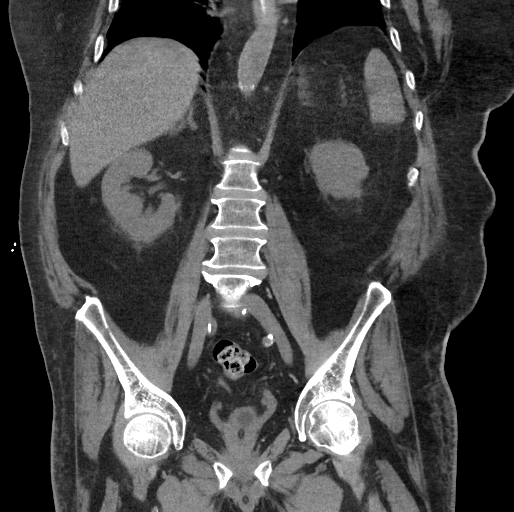

[15 of 46 positions shown; findings below may reference images not displayed]

FINDINGS: Lower chest: Atherosclerotic calcifications in the left anterior
descending, left circumflex and right coronary arteries
atherosclerosis of the distal descending thoracic aorta. Severe
calcifications of the inferior aspect of the mitral annulus.
Scarring and/or subsegmental atelectasis in the right lower lobe.
Nasogastric tube in the esophagus.

Hepatobiliary: No definite cystic or solid hepatic lesions are
identified on today's noncontrast CT examination. Unenhanced
appearance of the gallbladder is normal.

Pancreas: No pancreatic mass or peripancreatic inflammatory changes
on today's noncontrast CT examination.

Spleen: Unremarkable.

Adrenals/Urinary Tract: Bilateral adrenal glands are normal in
appearance. Left kidney is normal in appearance. Exophytic 1.5 cm
low-attenuation lesion in the lower pole of the right kidney is
incompletely characterized, but is statistically likely a cyst. No
urinary tract calculi. No hydroureteronephrosis. Urinary bladder is
completely decompressed around an indwelling Foley catheter. Small
volume of gas in the urinary bladder is presumably iatrogenic.

Stomach/Bowel: Unenhanced appearance of the stomach is normal.
Nasogastric tube extends into the duodenum at the junction of second
and third portions. No pathologic dilatation of small bowel. Status
post total colectomy with right lower quadrant ileostomy. Hartmann's
pouch.

Vascular/Lymphatic: Aortic atherosclerosis, without definite
aneurysm in the abdomen or pelvis on today's noncontrast CT
examination. No lymphadenopathy noted in the abdomen or pelvis.

Reproductive: Prostate gland and seminal vesicles are unremarkable
in appearance.

Other: Trace volume of ascites in the low anatomic pelvis, not
unexpected given the patient's recent surgical history. No
well-defined organized fluid collection to suggest intra abdominal
or pelvic abscess. No pneumoperitoneum.

Musculoskeletal: 4 mm of anterolisthesis of L4 upon L5. There are no
aggressive appearing lytic or blastic lesions noted in the
visualized portions of the skeleton.
IMPRESSION: 1. No acute findings in the abdomen or pelvis on today's noncontrast
CT examination.
2. Postoperative changes of recent total colectomy and right lower
quadrant ileostomy, without acute complicating features.
3. Aortic atherosclerosis, in addition to at least 3 vessel coronary
artery disease. Please note that although the presence of coronary
artery calcium documents the presence of coronary artery disease,
the severity of this disease and any potential stenosis cannot be
assessed on this non-gated CT examination. Assessment for potential
risk factor modification, dietary therapy or pharmacologic therapy
may be warranted, if clinically indicated.
4. There are severe calcifications of the mitral annulus.
Echocardiographic correlation for evaluation of potential valvular
dysfunction may be warranted if clinically indicated.
5. Additional incidental findings, as above.

## 2017-07-29 IMAGING — CR DG CHEST 1V PORT
1 series · 1 of 1 positions shown · non-contrast
Comparison: 02/07/2016

CLINICAL DATA: Baseline forced expiratory volume at end of one
[DATE]% to 80% predicted [id number]

EXAM:
PORTABLE CHEST 1 VIEW

[AP]
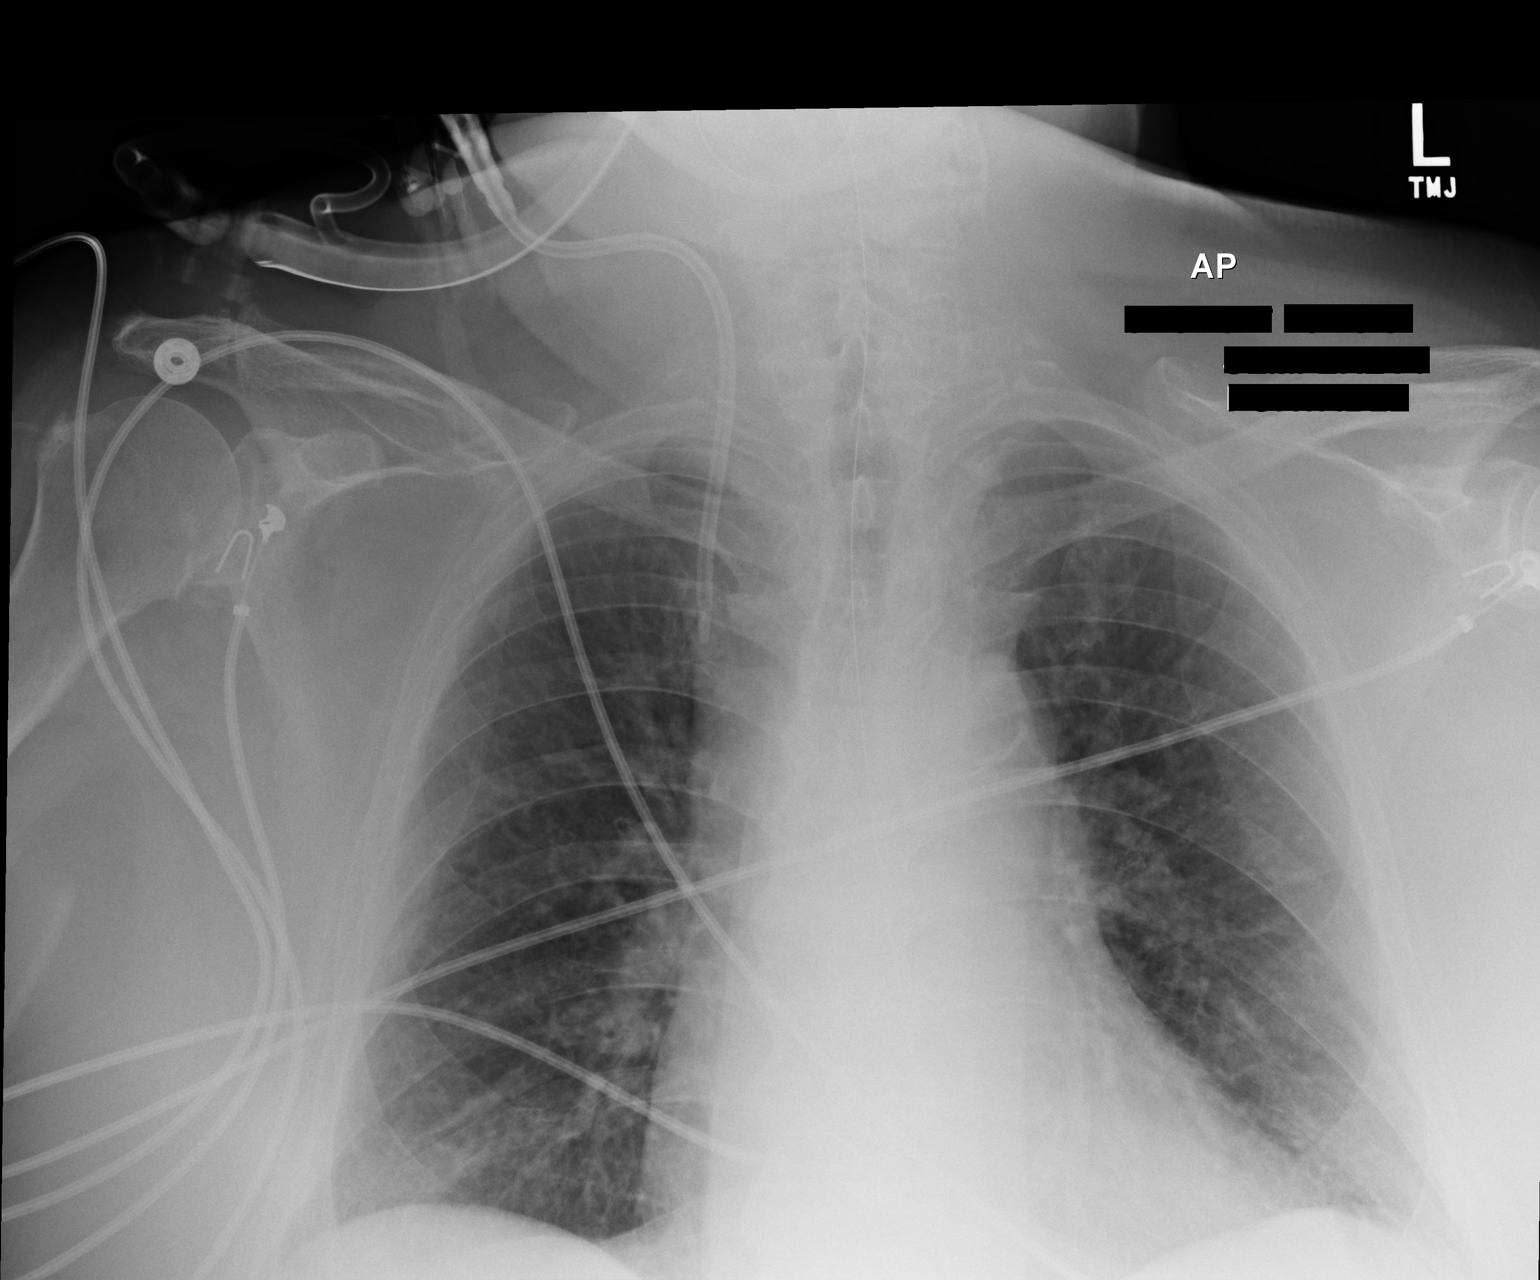

[1 of 1 positions shown; findings below may reference images not displayed]

FINDINGS: Cardiac silhouette is mildly enlarged. No mediastinal or hilar
masses or convincing adenopathy. The lungs are clear. No pleural
effusion. No pneumothorax.

Nasal/orogastric tube passes below the included field of view into
the stomach. Right internal jugular central venous line is stable
with its tip in the mid to upper superior vena cava.
IMPRESSION: No acute cardiopulmonary disease. No significant change from the
recent prior study.

## 2017-07-29 IMAGING — CR DG ABD PORTABLE 1V
1 series · 1 of 1 positions shown · non-contrast
Comparison: CT, 02/07/2016

CLINICAL DATA: NG tube placement.

EXAM:
PORTABLE ABDOMEN - 1 VIEW

[AP]
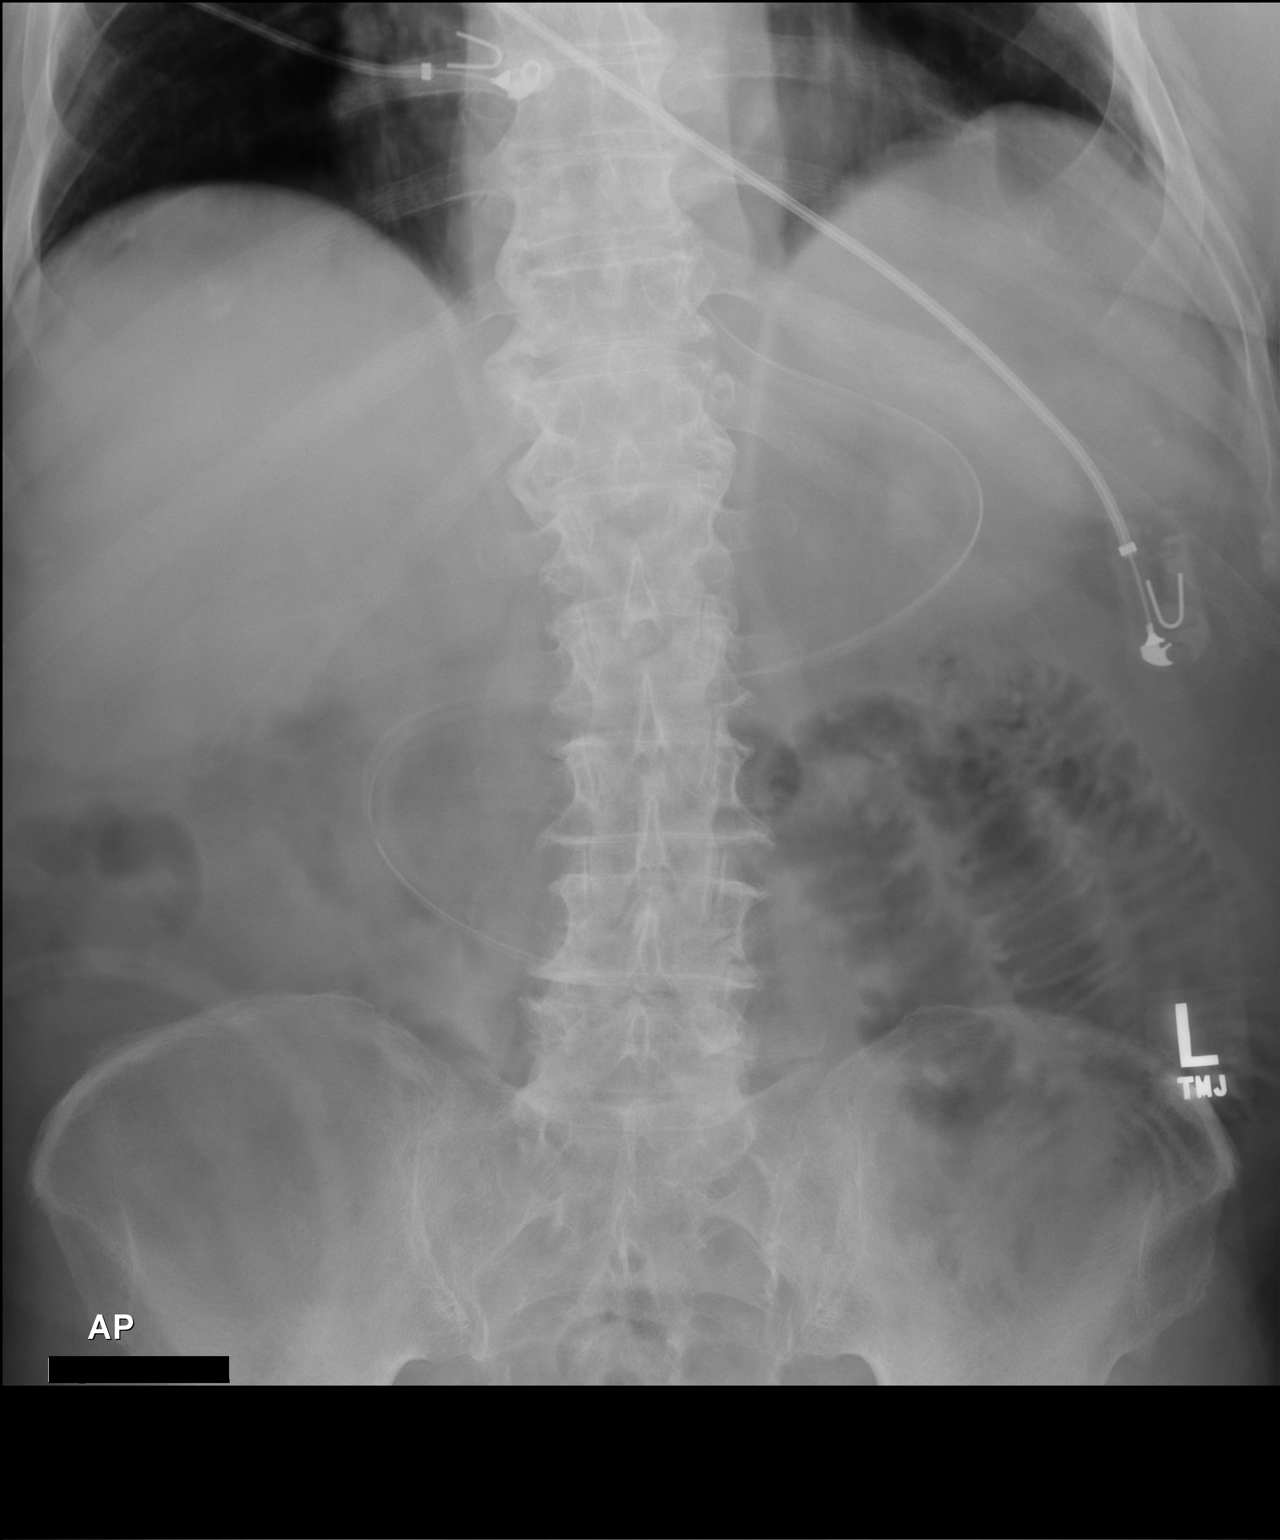

[1 of 1 positions shown; findings below may reference images not displayed]

FINDINGS: Nasogastric tube tip extends from the left upper quadrant curling
across the right mid abdomen to have its tip projecting over the
lower lumbar spine, likely in the duodenum.

There are mildly dilated partly air-filled loops of prominent small
bowel in the left mid to lower quadrant.
IMPRESSION: Nasogastric tube tip projects in the expected location of the
duodenum.

## 2017-08-09 IMAGING — XA IR PERC PLACEMENT GASTROSTOMY
1 series · 4 of 4 positions shown · non-contrast
Comparison: none

INDICATION: 65-year-old male with a history of dysphasia

[Series 300: dsa body · 4 of 4 slices shown]
[im 1/4]
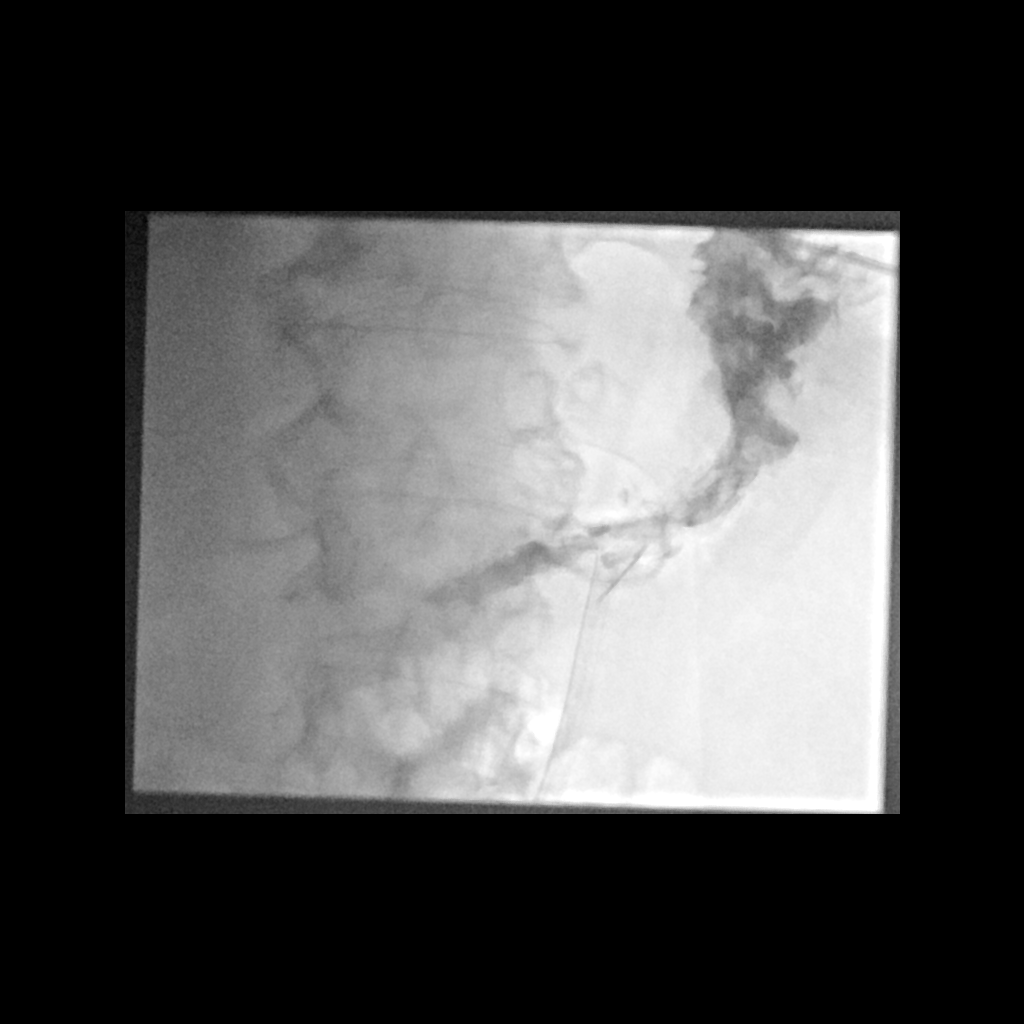
[im 2/4]
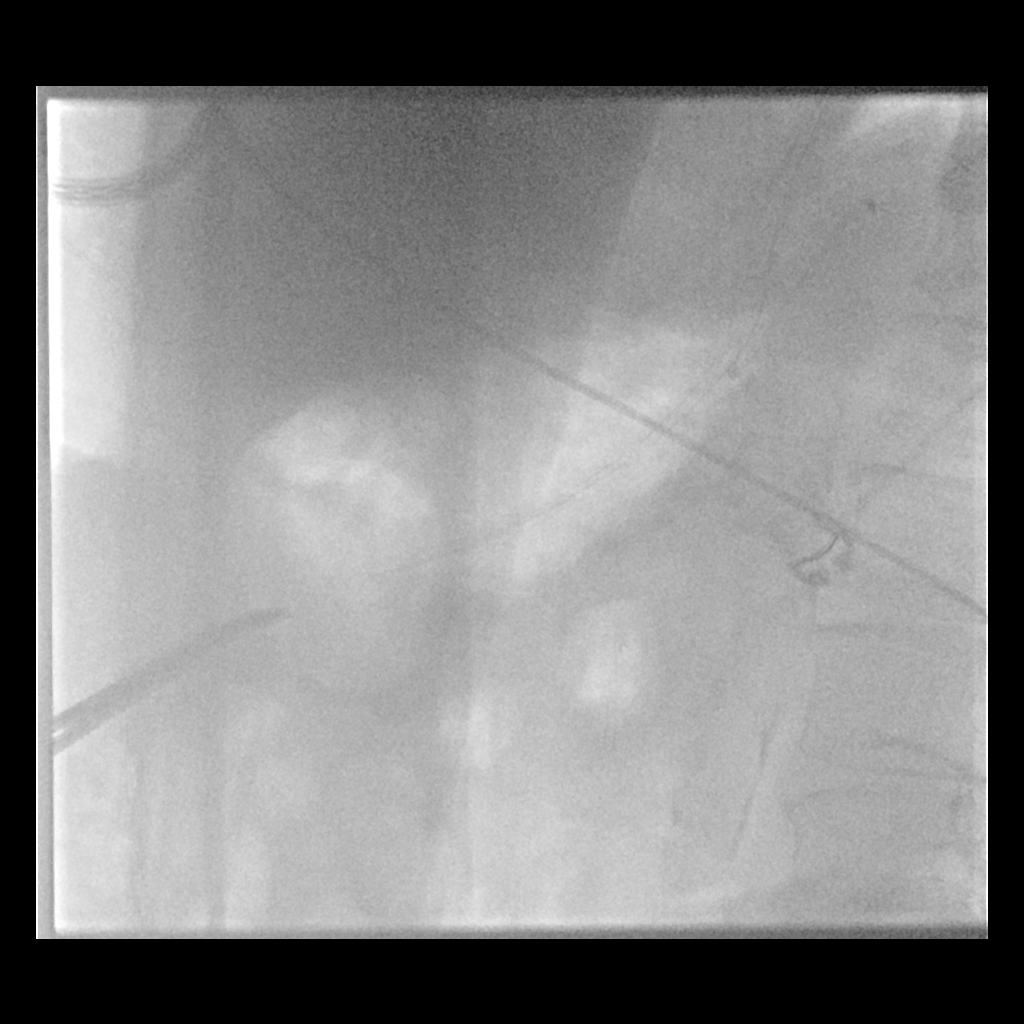
[im 3/4]
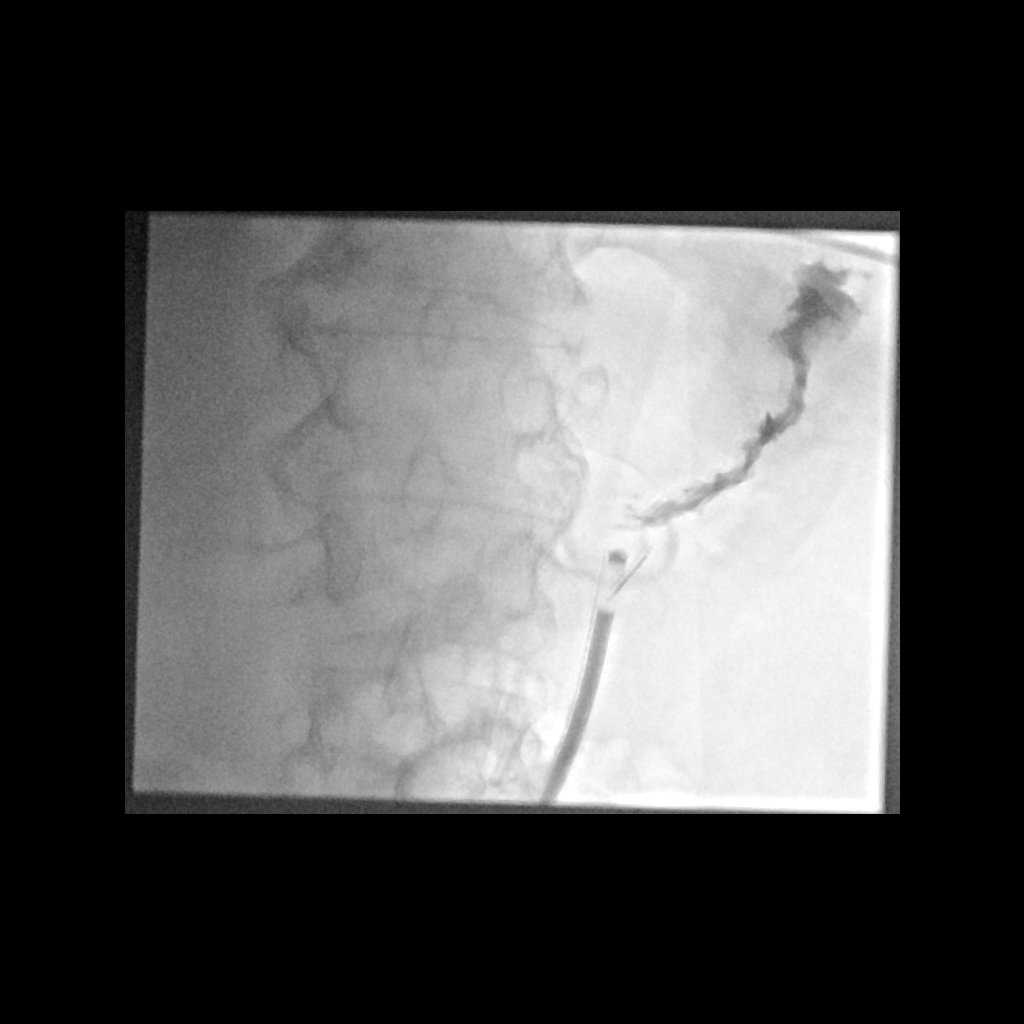
[im 4/4]
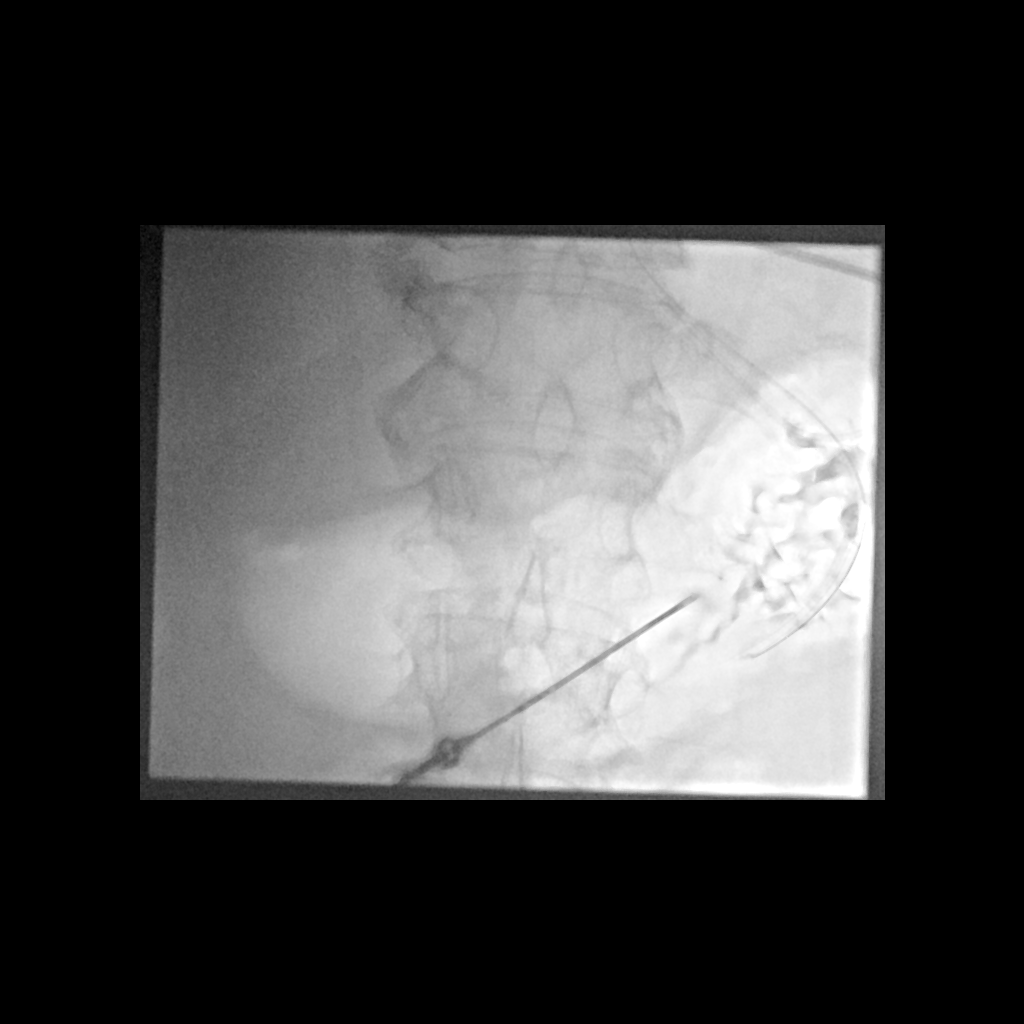

[4 of 4 positions shown; findings below may reference images not displayed]

EXAM:
PERC PLACEMENT GASTROSTOMY

MEDICATIONS:
2.0 g Ancef; Antibiotics were administered within 1 hour of the
procedure.

ANESTHESIA/SEDATION:
Versed 2.0 mg IV; Fentanyl 100 mcg IV

Moderate Sedation Time:  20

The patient was continuously monitored during the procedure by the
interventional radiology nurse under my direct supervision.

CONTRAST:  10 cc - administered into the gastric lumen.

FLUOROSCOPY TIME:  Fluoroscopy Time: 9 minutes 36 seconds (121 mGy).

COMPLICATIONS:
None

PROCEDURE:
Informed written consent was obtained from the patient and the
patient's family after a thorough discussion of the procedural
risks, benefits and alternatives. All questions were addressed.
Maximal Sterile Barrier Technique was utilized including caps, mask,
sterile gowns, sterile gloves, sterile drape, hand hygiene and skin
antiseptic. A timeout was performed prior to the initiation of the
procedure.

The epigastrium was prepped with Betadine in a sterile fashion, and
a sterile drape was applied covering the operative field. A sterile
gown and sterile gloves were used for the procedure.

A 5-French orogastric tube is placed under fluoroscopic guidance.
Scout imaging of the abdomen confirms barium within the transverse
colon.

The stomach was distended with gas. Under fluoroscopic guidance, an
18 gauge needle was utilized to puncture the anterior wall of the
body of the stomach. An Amplatz wire was advanced through the needle
passing a T fastener into the lumen of the stomach. The T fastener
was secured for gastropexy. A 9-French sheath was inserted.

A snare was advanced through the 9-French sheath. Mexira Skeph was
advanced through the orogastric tube. It was snared then pulled out
the oral cavity, pulling the snare, as well. The leading edge of the
gastrostomy was attached to the snare. It was then pulled down the
esophagus and out the percutaneous site. Tube secured in place.
Contrast was injected.

Patient tolerated the procedure well and remained hemodynamically
stable throughout.

No complications were encountered and no significant blood loss
encountered.
IMPRESSION: Status post fluoroscopic placed percutaneous gastrostomy tube, with
20 French pull-through.
